# Patient Record
Sex: Male | Born: 1955
Health system: Southern US, Community
[De-identification: ages and names within clinical notes are randomized; demographics above are authoritative.]

## PROBLEM LIST (undated history)

## (undated) DIAGNOSIS — R7303 Prediabetes: Secondary | ICD-10-CM

## (undated) DIAGNOSIS — F419 Anxiety disorder, unspecified: Secondary | ICD-10-CM

## (undated) DIAGNOSIS — I10 Essential (primary) hypertension: Secondary | ICD-10-CM

## (undated) DIAGNOSIS — I809 Phlebitis and thrombophlebitis of unspecified site: Secondary | ICD-10-CM

## (undated) DIAGNOSIS — C20 Malignant neoplasm of rectum: Secondary | ICD-10-CM

## (undated) DIAGNOSIS — R002 Palpitations: Secondary | ICD-10-CM

## (undated) HISTORY — DX: Palpitations: R00.2

## (undated) HISTORY — PX: COLONOSCOPY W/ POLYPECTOMY: SHX1380

## (undated) HISTORY — DX: Essential (primary) hypertension: I10

## (undated) HISTORY — DX: Phlebitis and thrombophlebitis of unspecified site: I80.9

## (undated) HISTORY — PX: WISDOM TOOTH EXTRACTION: SHX21

---

## 2007-05-16 ENCOUNTER — Ambulatory Visit: Payer: Self-pay | Admitting: Vascular Surgery

## 2007-08-10 HISTORY — PX: DOPPLER ECHOCARDIOGRAPHY: SHX263

## 2010-12-14 NOTE — Procedures (Signed)
DUPLEX DEEP VENOUS EXAM - LOWER EXTREMITY   INDICATION:  Right leg pain and swelling.   HISTORY:  Edema:  Yes  Trauma/Surgery:  Yes  Pain:  Right thigh  PE:  No  Previous DVT:  No   AGE:  55   DUPLEX EXAM:                CFV   SFV   PopV  PTV    GSV                R  L  R  L  R  L  R   L  R  L  Thrombosis    o  o  o     o     o      +  Spontaneous   +  +  +     +     +      o  Phasic        +  +  +     +     +      o  Augmentation  +  +  +     +     +      o  Compressible  +     +     +     +      o  Competent     +     +     +     +      o   Legend:  + - yes  o - no  p - partial  D - decreased   IMPRESSION:  1. No evidence of DVT noted in the right leg.  2. Thrombus noted in the right saphenofemoral junction and the greater      saphenous vein above knee level.    _____________________________  Larina Earthly, M.D.   MG/MEDQ  D:  05/16/2007  T:  05/17/2007  Job:  [id number]   cc:   Chales Salmon. Abigail Miyamoto, M.D.

## 2011-01-26 ENCOUNTER — Telehealth: Payer: Self-pay | Admitting: Cardiovascular Disease

## 2011-01-26 NOTE — Telephone Encounter (Signed)
PT LAST HERE IN 08 FOR SAME CHIEF COMPLAINTS AS GIVING TODAY. THINKS HE REALLY NEEDS TO SEE SOMEONE. ITS BEEN 4 YEARS, HE WANTS SOMETHING IN NEXT FEW DAYS. DON'T HAVE ANYTHING TO OFFER HIM. CHART GIVEN TO JODETTE.

## 2011-01-26 NOTE — Telephone Encounter (Signed)
App made 

## 2011-01-27 ENCOUNTER — Encounter: Payer: Self-pay | Admitting: Cardiology

## 2011-01-28 ENCOUNTER — Encounter: Payer: Self-pay | Admitting: Cardiovascular Disease

## 2011-01-28 ENCOUNTER — Ambulatory Visit (INDEPENDENT_AMBULATORY_CARE_PROVIDER_SITE_OTHER): Payer: BC Managed Care – PPO | Admitting: Cardiovascular Disease

## 2011-01-28 VITALS — BP 152/108 | HR 83 | Ht 75.0 in | Wt 210.0 lb

## 2011-01-28 DIAGNOSIS — I10 Essential (primary) hypertension: Secondary | ICD-10-CM

## 2011-01-28 DIAGNOSIS — R002 Palpitations: Secondary | ICD-10-CM | POA: Insufficient documentation

## 2011-01-28 MED ORDER — POTASSIUM CHLORIDE ER 10 MEQ PO TBCR: 10.0000 meq | EXTENDED_RELEASE_TABLET | ORAL | Status: DC

## 2011-01-28 MED ORDER — HYDROCHLOROTHIAZIDE 25 MG PO TABS: 25.0000 mg | ORAL_TABLET | Freq: Every day | ORAL | Status: DC

## 2011-01-28 MED ORDER — LOSARTAN POTASSIUM 50 MG PO TABS: 50.0000 mg | ORAL_TABLET | Freq: Every day | ORAL | Status: DC

## 2011-01-28 NOTE — Progress Notes (Signed)
Brent Potter Date of Birth  06-Oct-1955 [facility] [address]     [address] [place], [place]  [address] [phone]  Fax  [phone]  History of Present Illness:  Palpitations for the past month.  No cp. No dyspnea. Still running and biking regularly - with no difficulty.  Not associated with eating or drinking.  He stopped taking his supplements and now feels better.  Has had hypertension for the past several years.  For the past months, his BP has been elevated 120-150 / 80-94.  Stopped caffeine. Not eating salt.  Does eat a fair amount of carbs.     Current Outpatient Prescriptions on File Prior to Visit  Medication Sig Dispense Refill  . aspirin 81 MG EC tablet Take 81 mg by mouth daily.          No Known Allergies  Past Medical History  Diagnosis Date  . Palpitations   . Phlebitis     due to basketball injury    Past Surgical History  Procedure Date  . Doppler echocardiography 08/10/2007    EF 55-60%, impaired LV relataion, trace mitrial & tricuspid regurgitation    History  Smoking status  . Never Smoker   Smokeless tobacco  . Not on file    History  Alcohol Use No    Family History  Problem Relation Age of Onset  . Hypertension Father   . Thyroid disease Mother     Reviw of Systems:  Reviewed in the HPI.  All other systems are negative.  Physical Exam: BP 152/108  Pulse 83  Ht 6\' 3"  (1.905 m)  Wt 210 lb (95.255 kg)  BMI 26.25 kg/m2 The patient is alert and oriented x 3.  The mood and affect are normal.   Skin: warm and dry.  Color is normal.    HEENT:   the sclera are nonicteric.  The mucous membranes are moist.  The carotids are 2+ without bruits.  There is no thyromegaly.  There is no JVD.    Lungs: clear.  The chest wall is non tender.    Heart: regular rate with a normal S1 and S2.  There are no murmurs, gallops, or rubs. The PMI is not displaced.     Abdomin: good bowel sounds.  There  is no guarding or rebound.  There is no hepatosplenomegaly or tenderness.  There are no masses.   Extremities:  no clubbing, cyanosis, or edema.  The legs are without rashes.  The distal pulses are intact.   Neuro:  Cranial nerves II - XII are intact.  Motor and sensory functions are intact.    The gait is normal.  ECG: Normal sinus rhythm. He has no EKG changes  Assessment / Plan:

## 2011-01-28 NOTE — Assessment & Plan Note (Signed)
His palpitations seem To have resolved after he stopped taking several other supplements. He'll try supplements one at a time to see if he can identify which one is causing the palpitations.

## 2011-01-28 NOTE — Assessment & Plan Note (Signed)
His blood pressure remains mildly elevated. He states that it has been elevated for the past year or so. He has not wanted to start medications for fear of the side effects.  We'll start him on HCTZ 25 mg a day. He's to take 12-1/2 mg a day for the first several days. I've also given him a prescription for losartan 50 mg I was. He is to start that in a week or so. I've also given him potassium chloride 10 mEq a day. We'll see him again in 6 Weeks. We'll draw a basic metabolic profile at that time.

## 2011-01-31 ENCOUNTER — Encounter: Payer: Self-pay | Admitting: Cardiovascular Disease

## 2011-03-31 ENCOUNTER — Ambulatory Visit (INDEPENDENT_AMBULATORY_CARE_PROVIDER_SITE_OTHER): Payer: BC Managed Care – PPO | Admitting: Cardiovascular Disease

## 2011-03-31 ENCOUNTER — Encounter: Payer: Self-pay | Admitting: Cardiovascular Disease

## 2011-03-31 DIAGNOSIS — I10 Essential (primary) hypertension: Secondary | ICD-10-CM

## 2011-03-31 DIAGNOSIS — R002 Palpitations: Secondary | ICD-10-CM

## 2011-03-31 LAB — HEPATIC FUNCTION PANEL
AST: 28 U/L (ref 0–37)
Albumin: 4 g/dL (ref 3.5–5.2)
Total Protein: 7.2 g/dL (ref 6.0–8.3)

## 2011-03-31 LAB — LDL CHOLESTEROL, DIRECT: Direct LDL: 158.6 mg/dL

## 2011-03-31 LAB — BASIC METABOLIC PANEL
CO2: 32 mEq/L (ref 19–32)
Creatinine, Ser: 0.9 mg/dL (ref 0.4–1.5)
GFR: 88.5 mL/min (ref >60.00)
Glucose, Bld: 82 mg/dL (ref 70–99)
Sodium: 141 mEq/L (ref 135–145)

## 2011-03-31 LAB — LIPID PANEL
Cholesterol: 228 mg/dL — ABNORMAL HIGH (ref 0–200)
Total CHOL/HDL Ratio: 6
Triglycerides: 160 mg/dL — ABNORMAL HIGH (ref 0.0–149.0)
VLDL: 32 mg/dL (ref 0.0–40.0)

## 2011-03-31 NOTE — Assessment & Plan Note (Addendum)
Continue his current meds.  BMP pending.  Seems to be tolerating his medications quite well. He was seen again in one year for followup visit. We'll check a basic profile, lipid profile, and hepatic profile at that time.

## 2011-03-31 NOTE — Progress Notes (Signed)
Brent Potter Date of Birth  23-Feb-1956 [facility] / [facility] [address]     [address] [place], [place]  [address] [phone]  Fax  [phone]  History of Present Illness:  This is a 55 year old gentleman with a history of hypertension. He was seen several weeks ago. We started HCTZ and potassium. He did quite well his blood pressure was well controlled. We also started him on Losartan but his BP was too low . He is now discontinued the Losartan and  now feels quite well. His blood pressures have been well controlled.  He's remained very active. He cycling are irregular basis and has not had any problems   Current Outpatient Prescriptions on File Prior to Visit  Medication Sig Dispense Refill  . aspirin 81 MG EC tablet Take 81 mg by mouth daily.        . hydrochlorothiazide 25 MG tablet Take 1 tablet (25 mg total) by mouth daily.  30 tablet  12  . potassium chloride (K-DUR) 10 MEQ tablet Take 1 tablet (10 mEq total) by mouth 1 dose over 24 hours.  30 tablet  12  . testosterone (TESTIM) 50 MG/5GM GEL Place 5 g onto the skin daily.        Marland Kitchen venlafaxine (EFFEXOR-XR) 150 MG 24 hr capsule Take 150 mg by mouth daily.          No Known Allergies  Past Medical History  Diagnosis Date  . Palpitations   . Phlebitis     due to basketball injury  . HTN (hypertension)     Past Surgical History  Procedure Date  . Doppler echocardiography 08/10/2007    EF 55-60%, impaired LV relataion, trace mitrial & tricuspid regurgitation    History  Smoking status  . Never Smoker   Smokeless tobacco  . Not on file    History  Alcohol Use No    Family History  Problem Relation Age of Onset  . Hypertension Father   . Thyroid disease Mother     Reviw of Systems:  Reviewed in the HPI.  All other systems are negative.  Physical Exam: BP 114/78  Ht 6\' 3"  (1.905 m)  Wt 220 lb (99.791 kg)  BMI 27.50 kg/m2 The patient is alert and oriented x 3.   The mood and affect are normal.   Skin: warm and dry.  Color is normal.    HEENT:   the sclera are nonicteric.  The mucous membranes are moist.  The carotids are 2+ without bruits.  There is no thyromegaly.  There is no JVD.    Lungs: clear.  The chest wall is non tender.    Heart: regular rate with a normal S1 and S2.  There are no murmurs, gallops, or rubs. The PMI is not displaced.     Abdomen: good bowel sounds.  There is no guarding or rebound.  There is no hepatosplenomegaly or tenderness.  There are no masses.   Extremities:  no clubbing, cyanosis, or edema.  The legs are without rashes.  The distal pulses are intact.   Neuro:  Cranial nerves II - XII are intact.  Motor and sensory functions are intact.    The gait is normal.  ECG:  Assessment / Plan:

## 2011-03-31 NOTE — Assessment & Plan Note (Signed)
He continues to have some intermittent palpitations which I suspect are due to premature ventricular contractions. These are not troublesome. They're not associated with any specific symptoms. We'll continue to watch his electrolytes.

## 2011-04-05 ENCOUNTER — Telehealth: Payer: Self-pay | Admitting: *Deleted

## 2011-04-05 DIAGNOSIS — E785 Hyperlipidemia, unspecified: Secondary | ICD-10-CM

## 2011-04-05 MED ORDER — ATORVASTATIN CALCIUM 20 MG PO TABS: 20.0000 mg | ORAL_TABLET | Freq: Every day | ORAL | Status: DC

## 2011-04-05 NOTE — Telephone Encounter (Signed)
Informed pt i called in script for Lipitor 20 mg and he will need repeat labs in 3 months/orders placed. To call back with questions or concerns.

## 2011-04-05 NOTE — Telephone Encounter (Signed)
Message copied by Antony Odea on Tue Apr 05, 2011  4:41 PM ------      Message from: [place], Deloris Ping      Created: Tue Apr 05, 2011  5:59 AM       LDL is still elevated.  He should start Lipator 20 mg daily.  Recheck lipids, HFP, BMP in 3 months.

## 2011-04-06 ENCOUNTER — Other Ambulatory Visit: Payer: Self-pay | Admitting: *Deleted

## 2011-04-06 ENCOUNTER — Telehealth: Payer: Self-pay | Admitting: *Deleted

## 2011-04-06 DIAGNOSIS — E785 Hyperlipidemia, unspecified: Secondary | ICD-10-CM

## 2011-04-06 MED ORDER — ATORVASTATIN CALCIUM 20 MG PO TABS: 20.0000 mg | ORAL_TABLET | Freq: Every day | ORAL | Status: DC

## 2011-04-06 NOTE — Telephone Encounter (Signed)
He would like to wait to start Lipitor/ stating he was on vacation and has been eating terrible. Feels their not true values. Will have redraw end of next month and have you evaluate from there. App scheduled for lab redraw. FYI Alfonso Ramus RN

## 2011-04-06 NOTE — Telephone Encounter (Signed)
ok 

## 2011-05-24 ENCOUNTER — Other Ambulatory Visit: Payer: BC Managed Care – PPO | Admitting: *Deleted

## 2015-11-16 DIAGNOSIS — E782 Mixed hyperlipidemia: Secondary | ICD-10-CM | POA: Diagnosis not present

## 2015-11-16 DIAGNOSIS — Z125 Encounter for screening for malignant neoplasm of prostate: Secondary | ICD-10-CM | POA: Diagnosis not present

## 2015-11-16 DIAGNOSIS — E291 Testicular hypofunction: Secondary | ICD-10-CM | POA: Diagnosis not present

## 2015-11-16 DIAGNOSIS — I10 Essential (primary) hypertension: Secondary | ICD-10-CM | POA: Diagnosis not present

## 2015-11-16 DIAGNOSIS — Z79899 Other long term (current) drug therapy: Secondary | ICD-10-CM | POA: Diagnosis not present

## 2015-11-16 DIAGNOSIS — Z0000 Encounter for general adult medical examination without abnormal findings: Secondary | ICD-10-CM | POA: Diagnosis not present

## 2015-11-16 DIAGNOSIS — E559 Vitamin D deficiency, unspecified: Secondary | ICD-10-CM | POA: Diagnosis not present

## 2016-06-10 DIAGNOSIS — L304 Erythema intertrigo: Secondary | ICD-10-CM | POA: Diagnosis not present

## 2016-06-21 DIAGNOSIS — M25521 Pain in right elbow: Secondary | ICD-10-CM | POA: Diagnosis not present

## 2016-06-21 DIAGNOSIS — M7711 Lateral epicondylitis, right elbow: Secondary | ICD-10-CM | POA: Diagnosis not present

## 2016-07-08 DIAGNOSIS — M7711 Lateral epicondylitis, right elbow: Secondary | ICD-10-CM | POA: Diagnosis not present

## 2016-07-08 DIAGNOSIS — M79632 Pain in left forearm: Secondary | ICD-10-CM | POA: Diagnosis not present

## 2016-07-15 DIAGNOSIS — M7711 Lateral epicondylitis, right elbow: Secondary | ICD-10-CM | POA: Diagnosis not present

## 2016-07-15 DIAGNOSIS — M79632 Pain in left forearm: Secondary | ICD-10-CM | POA: Diagnosis not present

## 2016-07-29 DIAGNOSIS — M7711 Lateral epicondylitis, right elbow: Secondary | ICD-10-CM | POA: Diagnosis not present

## 2016-07-29 DIAGNOSIS — M79632 Pain in left forearm: Secondary | ICD-10-CM | POA: Diagnosis not present

## 2016-08-05 DIAGNOSIS — M79632 Pain in left forearm: Secondary | ICD-10-CM | POA: Diagnosis not present

## 2016-08-05 DIAGNOSIS — M7711 Lateral epicondylitis, right elbow: Secondary | ICD-10-CM | POA: Diagnosis not present

## 2016-08-12 DIAGNOSIS — M7711 Lateral epicondylitis, right elbow: Secondary | ICD-10-CM | POA: Diagnosis not present

## 2016-08-12 DIAGNOSIS — M79632 Pain in left forearm: Secondary | ICD-10-CM | POA: Diagnosis not present

## 2016-08-16 DIAGNOSIS — M7711 Lateral epicondylitis, right elbow: Secondary | ICD-10-CM | POA: Diagnosis not present

## 2016-08-16 DIAGNOSIS — M79632 Pain in left forearm: Secondary | ICD-10-CM | POA: Diagnosis not present

## 2016-08-19 DIAGNOSIS — M79632 Pain in left forearm: Secondary | ICD-10-CM | POA: Diagnosis not present

## 2016-08-19 DIAGNOSIS — M7711 Lateral epicondylitis, right elbow: Secondary | ICD-10-CM | POA: Diagnosis not present

## 2016-08-24 DIAGNOSIS — M79632 Pain in left forearm: Secondary | ICD-10-CM | POA: Diagnosis not present

## 2016-08-24 DIAGNOSIS — M7711 Lateral epicondylitis, right elbow: Secondary | ICD-10-CM | POA: Diagnosis not present

## 2016-08-26 DIAGNOSIS — M79632 Pain in left forearm: Secondary | ICD-10-CM | POA: Diagnosis not present

## 2016-08-26 DIAGNOSIS — M7711 Lateral epicondylitis, right elbow: Secondary | ICD-10-CM | POA: Diagnosis not present

## 2016-08-29 DIAGNOSIS — M7711 Lateral epicondylitis, right elbow: Secondary | ICD-10-CM | POA: Diagnosis not present

## 2016-08-29 DIAGNOSIS — M79632 Pain in left forearm: Secondary | ICD-10-CM | POA: Diagnosis not present

## 2016-09-02 DIAGNOSIS — M7711 Lateral epicondylitis, right elbow: Secondary | ICD-10-CM | POA: Diagnosis not present

## 2016-09-02 DIAGNOSIS — M79632 Pain in left forearm: Secondary | ICD-10-CM | POA: Diagnosis not present

## 2016-09-07 DIAGNOSIS — M7711 Lateral epicondylitis, right elbow: Secondary | ICD-10-CM | POA: Diagnosis not present

## 2016-09-07 DIAGNOSIS — M79632 Pain in left forearm: Secondary | ICD-10-CM | POA: Diagnosis not present

## 2016-09-16 DIAGNOSIS — M7711 Lateral epicondylitis, right elbow: Secondary | ICD-10-CM | POA: Diagnosis not present

## 2016-09-16 DIAGNOSIS — M79632 Pain in left forearm: Secondary | ICD-10-CM | POA: Diagnosis not present

## 2016-09-21 DIAGNOSIS — M7711 Lateral epicondylitis, right elbow: Secondary | ICD-10-CM | POA: Diagnosis not present

## 2016-09-21 DIAGNOSIS — M79632 Pain in left forearm: Secondary | ICD-10-CM | POA: Diagnosis not present

## 2016-09-23 DIAGNOSIS — M7711 Lateral epicondylitis, right elbow: Secondary | ICD-10-CM | POA: Diagnosis not present

## 2016-09-23 DIAGNOSIS — M79632 Pain in left forearm: Secondary | ICD-10-CM | POA: Diagnosis not present

## 2016-09-30 DIAGNOSIS — M79632 Pain in left forearm: Secondary | ICD-10-CM | POA: Diagnosis not present

## 2016-09-30 DIAGNOSIS — M7711 Lateral epicondylitis, right elbow: Secondary | ICD-10-CM | POA: Diagnosis not present

## 2016-10-07 DIAGNOSIS — M79632 Pain in left forearm: Secondary | ICD-10-CM | POA: Diagnosis not present

## 2016-10-07 DIAGNOSIS — M7711 Lateral epicondylitis, right elbow: Secondary | ICD-10-CM | POA: Diagnosis not present

## 2016-10-14 DIAGNOSIS — M79632 Pain in left forearm: Secondary | ICD-10-CM | POA: Diagnosis not present

## 2016-10-14 DIAGNOSIS — M7711 Lateral epicondylitis, right elbow: Secondary | ICD-10-CM | POA: Diagnosis not present

## 2016-10-21 DIAGNOSIS — M79632 Pain in left forearm: Secondary | ICD-10-CM | POA: Diagnosis not present

## 2016-10-21 DIAGNOSIS — M7711 Lateral epicondylitis, right elbow: Secondary | ICD-10-CM | POA: Diagnosis not present

## 2017-03-14 DIAGNOSIS — E291 Testicular hypofunction: Secondary | ICD-10-CM | POA: Diagnosis not present

## 2017-03-14 DIAGNOSIS — L03811 Cellulitis of head [any part, except face]: Secondary | ICD-10-CM | POA: Diagnosis not present

## 2017-03-14 DIAGNOSIS — Z0000 Encounter for general adult medical examination without abnormal findings: Secondary | ICD-10-CM | POA: Diagnosis not present

## 2017-03-14 DIAGNOSIS — Z125 Encounter for screening for malignant neoplasm of prostate: Secondary | ICD-10-CM | POA: Diagnosis not present

## 2017-03-14 DIAGNOSIS — E559 Vitamin D deficiency, unspecified: Secondary | ICD-10-CM | POA: Diagnosis not present

## 2017-03-14 DIAGNOSIS — I10 Essential (primary) hypertension: Secondary | ICD-10-CM | POA: Diagnosis not present

## 2017-03-14 DIAGNOSIS — E782 Mixed hyperlipidemia: Secondary | ICD-10-CM | POA: Diagnosis not present

## 2017-03-14 DIAGNOSIS — E8881 Metabolic syndrome: Secondary | ICD-10-CM | POA: Diagnosis not present

## 2017-03-14 DIAGNOSIS — Z79899 Other long term (current) drug therapy: Secondary | ICD-10-CM | POA: Diagnosis not present

## 2017-10-28 DIAGNOSIS — J209 Acute bronchitis, unspecified: Secondary | ICD-10-CM | POA: Diagnosis not present

## 2018-03-23 DIAGNOSIS — Z125 Encounter for screening for malignant neoplasm of prostate: Secondary | ICD-10-CM | POA: Diagnosis not present

## 2018-03-23 DIAGNOSIS — Z0001 Encounter for general adult medical examination with abnormal findings: Secondary | ICD-10-CM | POA: Diagnosis not present

## 2018-03-23 DIAGNOSIS — E291 Testicular hypofunction: Secondary | ICD-10-CM | POA: Diagnosis not present

## 2018-03-23 DIAGNOSIS — Z79899 Other long term (current) drug therapy: Secondary | ICD-10-CM | POA: Diagnosis not present

## 2018-03-23 DIAGNOSIS — E559 Vitamin D deficiency, unspecified: Secondary | ICD-10-CM | POA: Diagnosis not present

## 2018-03-23 DIAGNOSIS — E782 Mixed hyperlipidemia: Secondary | ICD-10-CM | POA: Diagnosis not present

## 2018-03-23 DIAGNOSIS — I10 Essential (primary) hypertension: Secondary | ICD-10-CM | POA: Diagnosis not present

## 2018-11-30 DIAGNOSIS — L814 Other melanin hyperpigmentation: Secondary | ICD-10-CM | POA: Diagnosis not present

## 2018-11-30 DIAGNOSIS — D2272 Melanocytic nevi of left lower limb, including hip: Secondary | ICD-10-CM | POA: Diagnosis not present

## 2018-11-30 DIAGNOSIS — L821 Other seborrheic keratosis: Secondary | ICD-10-CM | POA: Diagnosis not present

## 2018-11-30 DIAGNOSIS — C44519 Basal cell carcinoma of skin of other part of trunk: Secondary | ICD-10-CM | POA: Diagnosis not present

## 2018-11-30 DIAGNOSIS — D1801 Hemangioma of skin and subcutaneous tissue: Secondary | ICD-10-CM | POA: Diagnosis not present

## 2018-11-30 DIAGNOSIS — L570 Actinic keratosis: Secondary | ICD-10-CM | POA: Diagnosis not present

## 2019-03-29 DIAGNOSIS — L570 Actinic keratosis: Secondary | ICD-10-CM | POA: Diagnosis not present

## 2019-04-12 DIAGNOSIS — Z125 Encounter for screening for malignant neoplasm of prostate: Secondary | ICD-10-CM | POA: Diagnosis not present

## 2019-04-12 DIAGNOSIS — Z131 Encounter for screening for diabetes mellitus: Secondary | ICD-10-CM | POA: Diagnosis not present

## 2019-04-12 DIAGNOSIS — E559 Vitamin D deficiency, unspecified: Secondary | ICD-10-CM | POA: Diagnosis not present

## 2019-04-12 DIAGNOSIS — N529 Male erectile dysfunction, unspecified: Secondary | ICD-10-CM | POA: Diagnosis not present

## 2019-04-12 DIAGNOSIS — Z0000 Encounter for general adult medical examination without abnormal findings: Secondary | ICD-10-CM | POA: Diagnosis not present

## 2019-04-12 DIAGNOSIS — I10 Essential (primary) hypertension: Secondary | ICD-10-CM | POA: Diagnosis not present

## 2019-04-12 DIAGNOSIS — E291 Testicular hypofunction: Secondary | ICD-10-CM | POA: Diagnosis not present

## 2019-04-12 DIAGNOSIS — E8881 Metabolic syndrome: Secondary | ICD-10-CM | POA: Diagnosis not present

## 2019-04-12 DIAGNOSIS — E782 Mixed hyperlipidemia: Secondary | ICD-10-CM | POA: Diagnosis not present

## 2019-04-12 DIAGNOSIS — B009 Herpesviral infection, unspecified: Secondary | ICD-10-CM | POA: Diagnosis not present

## 2019-05-26 DIAGNOSIS — Z1211 Encounter for screening for malignant neoplasm of colon: Secondary | ICD-10-CM | POA: Diagnosis not present

## 2019-05-26 DIAGNOSIS — Z1212 Encounter for screening for malignant neoplasm of rectum: Secondary | ICD-10-CM | POA: Diagnosis not present

## 2019-07-19 DIAGNOSIS — Z1211 Encounter for screening for malignant neoplasm of colon: Secondary | ICD-10-CM | POA: Diagnosis not present

## 2019-07-19 DIAGNOSIS — Z01818 Encounter for other preprocedural examination: Secondary | ICD-10-CM | POA: Diagnosis not present

## 2019-08-02 DIAGNOSIS — F0280 Dementia in other diseases classified elsewhere without behavioral disturbance: Secondary | ICD-10-CM

## 2019-08-02 HISTORY — DX: Dementia in other diseases classified elsewhere, unspecified severity, without behavioral disturbance, psychotic disturbance, mood disturbance, and anxiety: F02.80

## 2019-08-09 DIAGNOSIS — I10 Essential (primary) hypertension: Secondary | ICD-10-CM | POA: Diagnosis not present

## 2019-08-09 DIAGNOSIS — Z1211 Encounter for screening for malignant neoplasm of colon: Secondary | ICD-10-CM | POA: Diagnosis not present

## 2019-08-09 DIAGNOSIS — R195 Other fecal abnormalities: Secondary | ICD-10-CM | POA: Diagnosis not present

## 2019-08-09 DIAGNOSIS — R1031 Right lower quadrant pain: Secondary | ICD-10-CM | POA: Diagnosis not present

## 2019-08-26 DIAGNOSIS — K4090 Unilateral inguinal hernia, without obstruction or gangrene, not specified as recurrent: Secondary | ICD-10-CM | POA: Diagnosis not present

## 2019-09-13 ENCOUNTER — Other Ambulatory Visit: Payer: Self-pay | Admitting: Surgery

## 2019-09-13 DIAGNOSIS — K4090 Unilateral inguinal hernia, without obstruction or gangrene, not specified as recurrent: Secondary | ICD-10-CM | POA: Diagnosis not present

## 2020-03-08 DIAGNOSIS — R0902 Hypoxemia: Secondary | ICD-10-CM | POA: Diagnosis not present

## 2020-03-08 DIAGNOSIS — R55 Syncope and collapse: Secondary | ICD-10-CM | POA: Diagnosis not present

## 2020-03-08 DIAGNOSIS — I959 Hypotension, unspecified: Secondary | ICD-10-CM | POA: Diagnosis not present

## 2020-03-08 DIAGNOSIS — R52 Pain, unspecified: Secondary | ICD-10-CM | POA: Diagnosis not present

## 2020-03-09 DIAGNOSIS — S7002XA Contusion of left hip, initial encounter: Secondary | ICD-10-CM | POA: Diagnosis not present

## 2020-03-09 DIAGNOSIS — M25552 Pain in left hip: Secondary | ICD-10-CM | POA: Diagnosis not present

## 2020-03-12 DIAGNOSIS — I10 Essential (primary) hypertension: Secondary | ICD-10-CM | POA: Diagnosis not present

## 2020-03-12 DIAGNOSIS — Z131 Encounter for screening for diabetes mellitus: Secondary | ICD-10-CM | POA: Diagnosis not present

## 2020-03-12 DIAGNOSIS — R4020 Unspecified coma: Secondary | ICD-10-CM | POA: Diagnosis not present

## 2020-03-12 DIAGNOSIS — H532 Diplopia: Secondary | ICD-10-CM | POA: Diagnosis not present

## 2020-03-12 DIAGNOSIS — W57XXXA Bitten or stung by nonvenomous insect and other nonvenomous arthropods, initial encounter: Secondary | ICD-10-CM | POA: Diagnosis not present

## 2020-03-12 DIAGNOSIS — E7800 Pure hypercholesterolemia, unspecified: Secondary | ICD-10-CM | POA: Diagnosis not present

## 2020-03-12 DIAGNOSIS — R5383 Other fatigue: Secondary | ICD-10-CM | POA: Diagnosis not present

## 2020-03-12 DIAGNOSIS — E559 Vitamin D deficiency, unspecified: Secondary | ICD-10-CM | POA: Diagnosis not present

## 2020-03-13 DIAGNOSIS — R55 Syncope and collapse: Secondary | ICD-10-CM | POA: Diagnosis not present

## 2020-03-16 ENCOUNTER — Other Ambulatory Visit: Payer: Self-pay | Admitting: Orthopedic Surgery

## 2020-03-16 DIAGNOSIS — M25552 Pain in left hip: Secondary | ICD-10-CM

## 2020-03-18 DIAGNOSIS — M25551 Pain in right hip: Secondary | ICD-10-CM | POA: Diagnosis not present

## 2020-03-19 DIAGNOSIS — H04123 Dry eye syndrome of bilateral lacrimal glands: Secondary | ICD-10-CM | POA: Diagnosis not present

## 2020-03-19 DIAGNOSIS — H532 Diplopia: Secondary | ICD-10-CM | POA: Diagnosis not present

## 2020-03-19 DIAGNOSIS — H40013 Open angle with borderline findings, low risk, bilateral: Secondary | ICD-10-CM | POA: Diagnosis not present

## 2020-03-25 DIAGNOSIS — S32435A Nondisplaced fracture of anterior column [iliopubic] of left acetabulum, initial encounter for closed fracture: Secondary | ICD-10-CM | POA: Diagnosis not present

## 2020-03-25 DIAGNOSIS — S32591A Other specified fracture of right pubis, initial encounter for closed fracture: Secondary | ICD-10-CM | POA: Diagnosis not present

## 2020-03-25 DIAGNOSIS — M25552 Pain in left hip: Secondary | ICD-10-CM | POA: Diagnosis not present

## 2020-04-09 DIAGNOSIS — D225 Melanocytic nevi of trunk: Secondary | ICD-10-CM | POA: Diagnosis not present

## 2020-04-09 DIAGNOSIS — L821 Other seborrheic keratosis: Secondary | ICD-10-CM | POA: Diagnosis not present

## 2020-04-09 DIAGNOSIS — D1801 Hemangioma of skin and subcutaneous tissue: Secondary | ICD-10-CM | POA: Diagnosis not present

## 2020-04-09 DIAGNOSIS — Z85828 Personal history of other malignant neoplasm of skin: Secondary | ICD-10-CM | POA: Diagnosis not present

## 2020-04-09 DIAGNOSIS — L570 Actinic keratosis: Secondary | ICD-10-CM | POA: Diagnosis not present

## 2020-04-24 DIAGNOSIS — S32434D Nondisplaced fracture of anterior column [iliopubic] of right acetabulum, subsequent encounter for fracture with routine healing: Secondary | ICD-10-CM | POA: Diagnosis not present

## 2020-06-11 DIAGNOSIS — I10 Essential (primary) hypertension: Secondary | ICD-10-CM | POA: Diagnosis not present

## 2020-06-11 DIAGNOSIS — E291 Testicular hypofunction: Secondary | ICD-10-CM | POA: Diagnosis not present

## 2020-06-11 DIAGNOSIS — Z125 Encounter for screening for malignant neoplasm of prostate: Secondary | ICD-10-CM | POA: Diagnosis not present

## 2020-06-11 DIAGNOSIS — Z0000 Encounter for general adult medical examination without abnormal findings: Secondary | ICD-10-CM | POA: Diagnosis not present

## 2020-06-11 DIAGNOSIS — E559 Vitamin D deficiency, unspecified: Secondary | ICD-10-CM | POA: Diagnosis not present

## 2020-06-11 DIAGNOSIS — N529 Male erectile dysfunction, unspecified: Secondary | ICD-10-CM | POA: Diagnosis not present

## 2020-06-11 DIAGNOSIS — E782 Mixed hyperlipidemia: Secondary | ICD-10-CM | POA: Diagnosis not present

## 2020-06-11 DIAGNOSIS — K4090 Unilateral inguinal hernia, without obstruction or gangrene, not specified as recurrent: Secondary | ICD-10-CM | POA: Diagnosis not present

## 2020-06-15 DIAGNOSIS — Z1159 Encounter for screening for other viral diseases: Secondary | ICD-10-CM | POA: Diagnosis not present

## 2020-06-18 DIAGNOSIS — K5730 Diverticulosis of large intestine without perforation or abscess without bleeding: Secondary | ICD-10-CM | POA: Diagnosis not present

## 2020-06-18 DIAGNOSIS — D123 Benign neoplasm of transverse colon: Secondary | ICD-10-CM | POA: Diagnosis not present

## 2020-06-18 DIAGNOSIS — C20 Malignant neoplasm of rectum: Secondary | ICD-10-CM | POA: Diagnosis not present

## 2020-06-18 DIAGNOSIS — D122 Benign neoplasm of ascending colon: Secondary | ICD-10-CM | POA: Diagnosis not present

## 2020-06-18 DIAGNOSIS — R195 Other fecal abnormalities: Secondary | ICD-10-CM | POA: Diagnosis not present

## 2020-07-07 ENCOUNTER — Ambulatory Visit: Payer: Self-pay | Admitting: Surgery

## 2020-07-07 DIAGNOSIS — C20 Malignant neoplasm of rectum: Secondary | ICD-10-CM | POA: Diagnosis not present

## 2020-07-08 ENCOUNTER — Other Ambulatory Visit (HOSPITAL_COMMUNITY): Payer: Self-pay | Admitting: Surgery

## 2020-07-08 ENCOUNTER — Other Ambulatory Visit: Payer: Self-pay | Admitting: Surgery

## 2020-07-08 DIAGNOSIS — C20 Malignant neoplasm of rectum: Secondary | ICD-10-CM

## 2020-07-13 ENCOUNTER — Ambulatory Visit: Payer: Self-pay | Admitting: Surgery

## 2020-07-13 DIAGNOSIS — C20 Malignant neoplasm of rectum: Secondary | ICD-10-CM | POA: Diagnosis not present

## 2020-07-13 NOTE — H&P (Signed)
Brent Potter Appointment: 07/13/2020 4:30 PM Location: [facility] Patient #: [id number] DOB: June 04, 1956 Divorced / Language: Cleophus Molt / Race: White Male  History of Present Illness Adin Hector MD; 07/13/2020 5:21 PM) The patient is a 64 year old male who presents with colorectal cancer. Note for "Colorectal cancer": ` ` ` Patient sent for surgical consultation at the request of Dr Clarene Essex, [facility]  Chief Complaint: Cancer found within rectal polyp. ` ` The patient is a gentleman with no prior colorectal history who was found to have a positive: Guarded. Underwent colonoscopy. Found to have some adenomas polyps. The larger one was found in the rectum. This is removed in a piecemeal fashion by Dr. Watt Climes. Pathology revealed a focus of adenocarcinoma within it at least T1 SM 1. Margin close to deep margin. Surgical consult requested to see if patient pain candidate for TEM resection for better margins in the hope that this is an early stage cancer. Patient was initially hesitant to consider full workup and now is proceeding with CEA and CT scans. Patient can comes by himself. He usually moves his bowels twice a day. Does not recall any history of bleeding. Was told he's had hemorrhoids. He's never had any abdominal surgery. He is to bicycle rather regularly. Place some intermedial sports. Does not smoke. No diabetes. No sleep apnea. No history of stroke or heart attack or COPD. GI personal family history underwhelming except for a maternal uncle that have been diagnosed with Crohn's. No surgeries.  No personal nor family history of GI/colon cancer, irritable bowel syndrome, allergy such as Celiac Sprue, dietary/dairy problems, colitis, ulcers nor gastritis. No recent sick contacts/gastroenteritis. No travel outside the country. No changes in diet. No dysphagia to solids or liquids. No significant heartburn or reflux. No melena, hematemesis, coffee  ground emesis. No evidence of prior gastric/peptic ulceration.  (Review of systems as stated in this history (HPI) or in the review of systems. Otherwise all other 12 point ROS are negative) ` ` ###########################################`  This patient encounter took 45 minutes today to perform the following: obtain history, perform exam, review outside records, interpret tests & imaging, counsel the patient on their diagnosis; and, document this encounter, including findings & plan in the electronic health record (EHR).   Allergies Mallie Snooks, CMA; 07/13/2020 3:49 PM) No Known Drug Allergies [09/13/2019]: Allergies Reconciled  Medication History Mallie Snooks, CMA; 07/13/2020 3:52 PM) CoQ10 (100MG  Capsule, Oral) Active. Testim (50 MG/5GM(1%) Gel, Transdermal) Active. Effexor XR (150MG  Capsule ER 24HR, Oral) Active. Multiple Vitamin (1 (one) Oral) Active. Omega-3 (1000MG  Capsule, Oral) Active. Vitamin D3 (125 MCG(5000 UT) Tablet, Oral) Active. Medications Reconciled    Vitals Mallie Snooks CMA; 07/13/2020 3:52 PM) 07/13/2020 3:52 PM Weight: 204.25 lb Height: 75in Body Surface Area: 2.21 m Body Mass Index: 25.53 kg/m  Temp.: 71F  Pulse: 97 (Regular)  P.OX: 97% (Room air) BP: 118/70(Sitting, Left Arm, Standard)        Physical Exam Adin Hector MD; 07/13/2020 4:36 PM)  General Mental Status-Alert. General Appearance-Not in acute distress, Not Sickly. Orientation-Oriented X3. Hydration-Well hydrated. Voice-Normal.  Integumentary Global Assessment Upon inspection and palpation of skin surfaces of the - Axillae: non-tender, no inflammation or ulceration, no drainage. and Distribution of scalp and body hair is normal. General Characteristics Temperature - normal warmth is noted.  Head and Neck Head-normocephalic, atraumatic with no lesions or palpable masses. Face Global Assessment - atraumatic, no absence of  expression. Neck Global Assessment - no abnormal movements,  no bruit auscultated on the right, no bruit auscultated on the left, no decreased range of motion, non-tender. Trachea-midline. Thyroid Gland Characteristics - non-tender.  Eye Eyeball - Left-Extraocular movements intact, No Nystagmus - Left. Eyeball - Right-Extraocular movements intact, No Nystagmus - Right. Cornea - Left-No Hazy - Left. Cornea - Right-No Hazy - Right. Sclera/Conjunctiva - Left-No scleral icterus, No Discharge - Left. Sclera/Conjunctiva - Right-No scleral icterus, No Discharge - Right. Pupil - Left-Direct reaction to light normal. Pupil - Right-Direct reaction to light normal.  ENMT Ears Pinna - Left - no drainage observed, no generalized tenderness observed. Pinna - Right - no drainage observed, no generalized tenderness observed. Nose and Sinuses External Inspection of the Nose - no destructive lesion observed. Inspection of the nares - Left - quiet respiration. Inspection of the nares - Right - quiet respiration. Mouth and Throat Lips - Upper Lip - no fissures observed, no pallor noted. Lower Lip - no fissures observed, no pallor noted. Nasopharynx - no discharge present. Oral Cavity/Oropharynx - Tongue - no dryness observed. Oral Mucosa - no cyanosis observed. Hypopharynx - no evidence of airway distress observed.  Chest and Lung Exam Inspection Movements - Normal and Symmetrical. Accessory muscles - No use of accessory muscles in breathing. Palpation Palpation of the chest reveals - Non-tender. Auscultation Breath sounds - Normal and Clear.  Cardiovascular Auscultation Rhythm - Regular. Murmurs & Other Heart Sounds - Auscultation of the heart reveals - No Murmurs and No Systolic Clicks.  Abdomen Inspection Inspection of the abdomen reveals - No Visible peristalsis and No Abnormal pulsations. Umbilicus - No Bleeding, No Urine drainage. Palpation/Percussion Palpation and  Percussion of the abdomen reveal - Soft, Non Tender, No Rebound tenderness, No Rigidity (guarding) and No Cutaneous hyperesthesia. Note: Abdomen soft. Nontender. Not distended. No umbilical or incisional hernias. No guarding.  Male Genitourinary Sexual Maturity Tanner 5 - Adult hair pattern and Adult penile size and shape. Note: Small but definite right inguinal hernia. Mild sensitivity left groin but no definite hernia there. Otherwise normal external male genitalia  Rectal Note: Perianal skin Skin clear. No anal fissure. No fistula. No abscess. Right posterior grade 3 prolapsing hemorrhoid.   I can palpate no scarring or diveting 7 cm circumferentially. Rectal wall full of soft stool.  Peripheral Vascular Upper Extremity Inspection - Left - No Cyanotic nailbeds - Left, Not Ischemic. Inspection - Right - No Cyanotic nailbeds - Right, Not Ischemic.  Neurologic Neurologic evaluation reveals -normal attention span and ability to concentrate, able to name objects and repeat phrases. Appropriate fund of knowledge , normal sensation and normal coordination. Mental Status Affect - not angry, not paranoid. Cranial Nerves-Normal Bilaterally. Gait-Normal.  Neuropsychiatric Mental status exam performed with findings of-able to articulate well with normal speech/language, rate, volume and coherence, thought content normal with ability to perform basic computations and apply abstract reasoning and no evidence of hallucinations, delusions, obsessions or homicidal/suicidal ideation. Note: Mildly anxious but consolable  Musculoskeletal Global Assessment Spine, Ribs and Pelvis - no instability, subluxation or laxity. Right Upper Extremity - no instability, subluxation or laxity.  Lymphatic Head & Neck  General Head & Neck Lymphatics: Bilateral - Description - No Localized lymphadenopathy. Axillary  General Axillary Region: Bilateral - Description - No Localized  lymphadenopathy. Femoral & Inguinal  Generalized Femoral & Inguinal Lymphatics: Left - Description - No Localized lymphadenopathy. Right - Description - No Localized lymphadenopathy.    Assessment & Plan Adin Hector MD; 07/13/2020 5:23 PM)  RECTAL CANCER (C20) Impression: Small  focus of adenocarcinoma within a rectal polyp status post polypectomy. Possible early as T1sm1.  Discussed options. Standard robotic rectosigmoid low anterior resection versus transanal TEM/TAMIS local full-thickness resection with closure for better margins.  He would like to try the less invasive TAMIS option for now. We will plan a bedside rigid proctoscopy for orientation and then robotic transanal TAMIS excision with closure for better margins. Should that show no residual cancer, that would argue towards and very very early rectal cancer. Then endoscopic follow-up. Should there have residual cancer within the specimen, then I would lean more toward standard robotic rectosigmoid resection. Because he hopes that we just need better margins on this to avoid a bigger operation, he would like to proceed with transanal robotic TAMIS resection first. Discussed with him. He did want to have handouts about standard MIS colorectal surgery as well so I gave that to him.  Patient then called his daughter who had many questions. I went back into the room and again discussed my recommendations to the daughter on the phone as well. Finnean Cerami up in [place]. She asked many appropriate questions. She's tried to help understand this to help her father as well. She expressed understanding appreciation. Patient was appreciative as well.  Current Plans Pt Education - [facility] TEM Education (Jayne Peckenpaugh): discussed with patient and provided information. The anatomy & physiology of the digestive tract was discussed. The pathophysiology of the rectal pathology was discussed. Natural history risks without surgery was discussed. I feel  the risks of no intervention will lead to serious problems that outweigh the operative risks; therefore, I recommended surgery.  Laparoscopic & open abdominal techniques were discussed. I recommended we start with a partial proctectomy by transanal endoscopic microsurgery (TEM) for excisional biopsy to remove the pathology and hopefully cure and/or control the pathology. This technique can offer less operative risk and faster post-operative recovery. Possible need for immediate or later abdominal surgery for further treatment was discussed.  Risks such as bleeding, abscess, reoperation, ostomy, heart attack, death, and other risks were discussed. I noted a good likelihood this will help address the problem. Goals of post-operative recovery were discussed as well. We will work to minimize complications. An educational handout was given as well. Questions were answered. The patient expresses understanding & wishes to proceed with surgery.  Pt Education - [facility] Colorectal Cancer (AT): discussed with patient and provided information. You are being scheduled for surgery- Our schedulers will call you.  You should hear from our office's scheduling department within 5 working days about the location, date, and time of surgery. We try to make accommodations for patient's preferences in scheduling surgery, but sometimes the OR schedule or the surgeon's schedule prevents [place] from making those accommodations.  If you have not heard from our office [phone]) in 5 working days, call the office and ask for your surgeon's nurse.  If you have other questions about your diagnosis, plan, or surgery, call the office and ask for your surgeon's nurse.  Written instructions provided  PREOP COLON - ENCOUNTER FOR PREOPERATIVE EXAMINATION FOR GENERAL SURGICAL PROCEDURE (Z01.818)  Current Plans Pt Education - [facility] Colon Bowel Prep 2018 ERAS/Miralax/Antibiotics Started Neomycin Sulfate 500 MG Oral Tablet, 2  (two) Tablet SEE NOTE, #6, 07/13/2020, No Refill. Local Order: Pharmacist Notes: TAKE TWO TABLETS AT 2 PM, 3 PM, AND 10 PM THE DAY PRIOR TO SURGERY Started metroNIDAZOLE 500 MG Oral Tablet, 2 (two) Tablet three times daily, #6, 1 day starting 07/13/2020, No Refill. Local Order: Pharmacist  Notes: Pharmacy Instructions: Take 2 tablets at 2pm, 3pm, and 10pm the day prior to your colon operation. Pt Education - Pamphlet Given - Laparoscopic Colorectal Surgery: discussed with patient and provided information.  Adin Hector, MD, FACS, MASCRS Gastrointestinal and Minimally Invasive Surgery  [facility] [address], [place], [place] [address] [phone] Fax [phone] Main/Paging  CONTACT INFORMATION: Weekday (9AM-5PM) concerns: Call [facility] main office at [phone] Weeknight (5PM-9AM) or Weekend/Holiday concerns: Check [email or web address] for General Surgery [facility] coverage (Please, do not use SecureChat as it is not reliable communication to operating surgeons for immediate patient care)

## 2020-07-14 ENCOUNTER — Ambulatory Visit (HOSPITAL_COMMUNITY): Payer: BC Managed Care – PPO

## 2020-07-17 ENCOUNTER — Ambulatory Visit (HOSPITAL_COMMUNITY)
Admission: RE | Admit: 2020-07-17 | Discharge: 2020-07-17 | Disposition: A | Discharge status: Home / Self Care | Payer: BC Managed Care – PPO | Source: Ambulatory Visit | Attending: Surgery | Admitting: Surgery

## 2020-07-17 DIAGNOSIS — C20 Malignant neoplasm of rectum: Secondary | ICD-10-CM

## 2020-07-17 DIAGNOSIS — K8020 Calculus of gallbladder without cholecystitis without obstruction: Secondary | ICD-10-CM | POA: Diagnosis not present

## 2020-07-17 DIAGNOSIS — R918 Other nonspecific abnormal finding of lung field: Secondary | ICD-10-CM | POA: Diagnosis not present

## 2020-07-17 LAB — POCT I-STAT CREATININE: Creatinine, Ser: 1 mg/dL (ref 0.61–1.24)

## 2020-07-17 MED ORDER — IOHEXOL 300 MG/ML  SOLN
100.0000 mL | Freq: Once | INTRAMUSCULAR | Status: AC | PRN
  Administered 2020-07-17: 100 mL via INTRAVENOUS

## 2020-08-05 NOTE — Patient Instructions (Addendum)
DUE TO COVID-19 ONLY ONE VISITOR IS ALLOWED TO COME WITH YOU AND STAY IN THE WAITING ROOM ONLY DURING PRE OP AND PROCEDURE.   IF YOU WILL BE ADMITTED INTO THE HOSPITAL YOU ARE ALLOWED ONE SUPPORT PERSON DURING VISITATION HOURS ONLY (10AM -8PM)   . The support person may change daily. . The support person must pass our screening, gel in and out, and wear a mask at all times, including in the patient's room. . Patients must also wear a mask when staff or their support person are in the room.   COVID SWAB TESTING MUST BE COMPLETED ON:  Tuesday, 08-11-20 @ 8:30 AM   [address] [place], [place] [address]  (Must self quarantine after testing. Follow instructions on handout.)    Your procedure is scheduled on:   Friday, 08-14-20   Report to [facility] Main  Entrance   Report to admitting at 12:30 PM   Call this number if you have problems the morning of surgery [phone]    Clear liquids day of prep to prevent dehydration.   Do not eat food :After Midnight.   May have liquids until 11:30 AM day of surgery  CLEAR LIQUID DIET  Foods Allowed                                                                     Foods Excluded  Water, Black Coffee and tea, regular and decaf             liquids that you cannot  Plain Jell-O in any flavor  (No red)                                    see through such as: Fruit ices (not with fruit pulp)                                      milk, soups, orange juice              Iced Popsicles (No red)                                      All solid food                                   Apple juices Sports drinks like Gatorade (No red) Lightly seasoned clear broth or consume(fat free) Sugar, honey syrup  Sample Menu Breakfast                                Lunch                                     Supper Cranberry juice  Beef broth                            Chicken broth Jell-O                                     Grape juice                            Apple juice Coffee or tea                        Jell-O                                      Popsicle                                                Coffee or tea                        Coffee or tea      Drink 2 Ensure drinks the night before surgery, complete by 10:00 PM.   Complete one Ensure drink the morning of surgery 3 hours prior to scheduled surgery at 11:30 AM   Oral Hygiene is also important to reduce your risk of infection.                                    Remember - BRUSH YOUR TEETH THE MORNING OF SURGERY WITH YOUR REGULAR TOOTHPASTE   Do NOT smoke after Midnight   Take these medicines the morning of surgery with A SIP OF WATER:   Effexor                               You may not have any metal on your body including jewelry, and body piercings             Do not wear  lotions, powders, perfumes/cologne, or deodorant             Men may shave face and neck.   Do not bring valuables to the hospital. [place].   Contacts, dentures or bridgework may not be worn into surgery.   Bring small overnight bag day of surgery.                 Please read over the following fact sheets you were given: IF YOU HAVE QUESTIONS ABOUT YOUR PRE OP INSTRUCTIONS PLEASE CALL [phone]   [place] - Preparing for Surgery Before surgery, you can play an important role.  Because skin is not sterile, your skin needs to be as free of germs as possible.  You can reduce the number of germs on your skin by washing with CHG (chlorahexidine gluconate) soap before surgery.  CHG is an antiseptic cleaner which kills germs and bonds with the skin to continue killing germs even after washing. Please DO NOT use if  you have an allergy to CHG or antibacterial soaps.  If your skin becomes reddened/irritated stop using the CHG and inform your nurse when you arrive at Short Stay. Do not shave (including legs and underarms) for at least 48 hours  prior to the first CHG shower.  You may shave your face/neck.  Please follow these instructions carefully:  1.  Shower with CHG Soap the night before surgery and the  morning of surgery.  2.  If you choose to wash your hair, wash your hair first as usual with your normal  shampoo.  3.  After you shampoo, rinse your hair and body thoroughly to remove the shampoo.                             4.  Use CHG as you would any other liquid soap.  You can apply chg directly to the skin and wash.  Gently with a scrungie or clean washcloth.  5.  Apply the CHG Soap to your body ONLY FROM THE NECK DOWN.   Do   not use on face/ open                           Wound or open sores. Avoid contact with eyes, ears mouth and   genitals (private parts).                       Wash face,  Genitals (private parts) with your normal soap.             6.  Wash thoroughly, paying special attention to the area where your    surgery  will be performed.  7.  Thoroughly rinse your body with warm water from the neck down.  8.  DO NOT shower/wash with your normal soap after using and rinsing off the CHG Soap.                9.  Pat yourself dry with a clean towel.            10.  Wear clean pajamas.            11.  Place clean sheets on your bed the night of your first shower and do not  sleep with pets. Day of Surgery : Do not apply any lotions/deodorants the morning of surgery.  Please wear clean clothes to the hospital/surgery center.  FAILURE TO FOLLOW THESE INSTRUCTIONS MAY RESULT IN THE CANCELLATION OF YOUR SURGERY  PATIENT SIGNATURE_________________________________  NURSE SIGNATURE__________________________________  ________________________________________________________________________   Adam Phenix  An incentive spirometer is a tool that can help keep your lungs clear and active. This tool measures how well you are filling your lungs with each breath. Taking long deep breaths may help reverse or  decrease the chance of developing breathing (pulmonary) problems (especially infection) following:  A long period of time when you are unable to move or be active. BEFORE THE PROCEDURE   If the spirometer includes an indicator to show your best effort, your nurse or respiratory therapist will set it to a desired goal.  If possible, sit up straight or lean slightly forward. Try not to slouch.  Hold the incentive spirometer in an upright position. INSTRUCTIONS FOR USE  1. Sit on the edge of your bed if possible, or sit up as far as you can in bed or on a chair. 2. Hold  the incentive spirometer in an upright position. 3. Breathe out normally. 4. Place the mouthpiece in your mouth and seal your lips tightly around it. 5. Breathe in slowly and as deeply as possible, raising the piston or the ball toward the top of the column. 6. Hold your breath for 3-5 seconds or for as long as possible. Allow the piston or ball to fall to the bottom of the column. 7. Remove the mouthpiece from your mouth and breathe out normally. 8. Rest for a few seconds and repeat Steps 1 through 7 at least 10 times every 1-2 hours when you are awake. Take your time and take a few normal breaths between deep breaths. 9. The spirometer may include an indicator to show your best effort. Use the indicator as a goal to work toward during each repetition. 10. After each set of 10 deep breaths, practice coughing to be sure your lungs are clear. If you have an incision (the cut made at the time of surgery), support your incision when coughing by placing a pillow or rolled up towels firmly against it. Once you are able to get out of bed, walk around indoors and cough well. You may stop using the incentive spirometer when instructed by your caregiver.  RISKS AND COMPLICATIONS  Take your time so you do not get dizzy or light-headed.  If you are in pain, you may need to take or ask for pain medication before doing incentive spirometry.  It is harder to take a deep breath if you are having pain. AFTER USE  Rest and breathe slowly and easily.  It can be helpful to keep track of a log of your progress. Your caregiver can provide you with a simple table to help with this. If you are using the spirometer at home, follow these instructions: SEEK MEDICAL CARE IF:   You are having difficultly using the spirometer.  You have trouble using the spirometer as often as instructed.  Your pain medication is not giving enough relief while using the spirometer.  You develop fever of 100.5 F (38.1 C) or higher. SEEK IMMEDIATE MEDICAL CARE IF:   You cough up bloody sputum that had not been present before.  You develop fever of 102 F (38.9 C) or greater.  You develop worsening pain at or near the incision site. MAKE SURE YOU:   Understand these instructions.  Will watch your condition.  Will get help right away if you are not doing well or get worse. Document Released: 11/28/2006 Document Revised: 10/10/2011 Document Reviewed: 01/29/2007 ExitCare Patient Information 2014 ExitCare, [place].   ________________________________________________________________________  WHAT IS A BLOOD TRANSFUSION? Blood Transfusion Information  A transfusion is the replacement of blood or some of its parts. Blood is made up of multiple cells which provide different functions.  Red blood cells carry oxygen and are used for blood loss replacement.  White blood cells fight against infection.  Platelets control bleeding.  Plasma helps clot blood.  Other blood products are available for specialized needs, such as hemophilia or other clotting disorders. BEFORE THE TRANSFUSION  Who gives blood for transfusions?   Healthy volunteers who are fully evaluated to make sure their blood is safe. This is blood bank blood. Transfusion therapy is the safest it has ever been in the practice of medicine. Before blood is taken from a donor, a complete  history is taken to make sure that person has no history of diseases nor engages in risky social behavior (examples are intravenous drug  use or sexual activity with multiple partners). The donor's travel history is screened to minimize risk of transmitting infections, such as malaria. The donated blood is tested for signs of infectious diseases, such as HIV and hepatitis. The blood is then tested to be sure it is compatible with you in order to minimize the chance of a transfusion reaction. If you or a relative donates blood, this is often done in anticipation of surgery and is not appropriate for emergency situations. It takes many days to process the donated blood. RISKS AND COMPLICATIONS Although transfusion therapy is very safe and saves many lives, the main dangers of transfusion include:   Getting an infectious disease.  Developing a transfusion reaction. This is an allergic reaction to something in the blood you were given. Every precaution is taken to prevent this. The decision to have a blood transfusion has been considered carefully by your caregiver before blood is given. Blood is not given unless the benefits outweigh the risks. AFTER THE TRANSFUSION  Right after receiving a blood transfusion, you will usually feel much better and more energetic. This is especially true if your red blood cells have gotten low (anemic). The transfusion raises the level of the red blood cells which carry oxygen, and this usually causes an energy increase.  The nurse administering the transfusion will monitor you carefully for complications. HOME CARE INSTRUCTIONS  No special instructions are needed after a transfusion. You may find your energy is better. Speak with your caregiver about any limitations on activity for underlying diseases you may have. SEEK MEDICAL CARE IF:   Your condition is not improving after your transfusion.  You develop redness or irritation at the intravenous (IV) site. SEEK  IMMEDIATE MEDICAL CARE IF:  Any of the following symptoms occur over the next 12 hours:  Shaking chills.  You have a temperature by mouth above 102 F (38.9 C), not controlled by medicine.  Chest, back, or muscle pain.  People around you feel you are not acting correctly or are confused.  Shortness of breath or difficulty breathing.  Dizziness and fainting.  You get a rash or develop hives.  You have a decrease in urine output.  Your urine turns a dark color or changes to pink, red, or brown. Any of the following symptoms occur over the next 10 days:  You have a temperature by mouth above 102 F (38.9 C), not controlled by medicine.  Shortness of breath.  Weakness after normal activity.  The white part of the eye turns yellow (jaundice).  You have a decrease in the amount of urine or are urinating less often.  Your urine turns a dark color or changes to pink, red, or brown. Document Released: 07/15/2000 Document Revised: 10/10/2011 Document Reviewed: 03/03/2008 [facility] Patient Information 2014 [place], [place].  _______________________________________________________________________

## 2020-08-05 NOTE — Progress Notes (Addendum)
COVID Vaccine Completed:  x3 Date COVID Vaccine completed:  Booster 07-2020 COVID vaccine manufacturer: Pfizer       PCP - Marden Noble, MD Cardiologist - Kristeen Miss, MD.  Last OV 2012.  Eval by choice by patient.  He was a runner and wanted to be evaluated, workup negative.  Chest x-ray - CT chest 07-17-20 EKG - 08-06-20 in Epic Stress Test -  ECHO -  Cardiac Cath -  Pacemaker/ICD device last checked:  Sleep Study - N/A CPAP -   Fasting Blood Sugar - N/A Checks Blood Sugar _____ times a day  Blood Thinner Instructions:  N/A Aspirin Instructions: Last Dose:  Anesthesia review:   Patient denies shortness of breath, fever, cough and chest pain at PAT appointment. Pt able to climb a flight of stairs and perform all ADLs independently.   Patient verbalized understanding of instructions that were given to them at the PAT appointment. Patient was also instructed that they will need to review over the PAT instructions again at home before surgery.

## 2020-08-06 ENCOUNTER — Encounter (HOSPITAL_COMMUNITY)
Admission: RE | Admit: 2020-08-06 | Discharge: 2020-08-06 | Disposition: A | Discharge status: Home / Self Care | Payer: BC Managed Care – PPO | Source: Ambulatory Visit | Attending: Surgery | Admitting: Surgery

## 2020-08-06 ENCOUNTER — Encounter (HOSPITAL_COMMUNITY): Payer: Self-pay

## 2020-08-06 ENCOUNTER — Other Ambulatory Visit: Payer: Self-pay

## 2020-08-06 DIAGNOSIS — Z01818 Encounter for other preprocedural examination: Secondary | ICD-10-CM | POA: Diagnosis not present

## 2020-08-06 DIAGNOSIS — I10 Essential (primary) hypertension: Secondary | ICD-10-CM | POA: Diagnosis not present

## 2020-08-06 HISTORY — DX: Anxiety disorder, unspecified: F41.9

## 2020-08-06 HISTORY — DX: Malignant neoplasm of rectum: C20

## 2020-08-06 HISTORY — DX: Prediabetes: R73.03

## 2020-08-06 LAB — CBC
HCT: 49.1 % (ref 39.0–52.0)
Hemoglobin: 16.9 g/dL (ref 13.0–17.0)
MCH: 30.3 pg (ref 26.0–34.0)
MCHC: 34.4 g/dL (ref 30.0–36.0)
MCV: 88 fL (ref 80.0–100.0)
Platelets: 195 10*3/uL (ref 150–400)
RBC: 5.58 MIL/uL (ref 4.22–5.81)
RDW: 12.3 % (ref 11.5–15.5)
WBC: 7.4 10*3/uL (ref 4.0–10.5)
nRBC: 0 % (ref 0.0–0.2)

## 2020-08-06 LAB — BASIC METABOLIC PANEL
Anion gap: 7 (ref 5–15)
BUN: 26 mg/dL — ABNORMAL HIGH (ref 8–23)
CO2: 30 mmol/L (ref 22–32)
Calcium: 9.5 mg/dL (ref 8.9–10.3)
Chloride: 102 mmol/L (ref 98–111)
Creatinine, Ser: 0.91 mg/dL (ref 0.61–1.24)
GFR, Estimated: >60 mL/min (ref >60)
Glucose, Bld: 95 mg/dL (ref 70–99)
Potassium: 4.5 mmol/L (ref 3.5–5.1)
Sodium: 139 mmol/L (ref 135–145)

## 2020-08-06 LAB — HEMOGLOBIN A1C
Hgb A1c MFr Bld: 5.1 % (ref 4.8–5.6)
Mean Plasma Glucose: 99.67 mg/dL

## 2020-08-06 NOTE — Consult Note (Signed)
[facility] Nurse requested for preoperative stoma site marking  Patient was very alarmed on the need for ostomy pouch; long long discussion on best practice for marking and rationale for marking. Met with patient <30 mins to prepare for allowing me to mark this patient    Explained role of the [facility] nurse team.  Provided the patient with educational booklet and provided samples of pouching options.   Examined patient sitting, and standing in order to place the marking in the patient's visual field, away from any creases or abdominal contour issues and within the rectus muscle.  Marked for colostomy in the LLQ  3____ cm to the left of the umbilicus and _8___HT above the umbilicus.  Marked for ileostomy in the RLQ  _4___cm to the right of the umbilicus and  _0.9___ cm above the umbilicus.   Patient's abdomen cleansed with CHG wipes at site markings, allowed to air dry prior to marking.Covered mark with thin film transparent dressing to preserve mark until date of surgery.   [place] Nurse team will follow up with patient after surgery for continue ostomy care and teaching.  [place] MSN, [place], [place], [place]

## 2020-08-11 ENCOUNTER — Other Ambulatory Visit (HOSPITAL_COMMUNITY)
Admission: RE | Admit: 2020-08-11 | Discharge: 2020-08-11 | Disposition: A | Discharge status: Home / Self Care | Payer: BC Managed Care – PPO | Source: Ambulatory Visit | Attending: Surgery | Admitting: Surgery

## 2020-08-11 DIAGNOSIS — C20 Malignant neoplasm of rectum: Secondary | ICD-10-CM | POA: Diagnosis not present

## 2020-08-11 DIAGNOSIS — K642 Third degree hemorrhoids: Secondary | ICD-10-CM | POA: Diagnosis not present

## 2020-08-11 DIAGNOSIS — I10 Essential (primary) hypertension: Secondary | ICD-10-CM | POA: Diagnosis not present

## 2020-08-11 DIAGNOSIS — Z79899 Other long term (current) drug therapy: Secondary | ICD-10-CM | POA: Diagnosis not present

## 2020-08-11 DIAGNOSIS — K621 Rectal polyp: Secondary | ICD-10-CM | POA: Diagnosis not present

## 2020-08-11 DIAGNOSIS — Z20822 Contact with and (suspected) exposure to covid-19: Secondary | ICD-10-CM | POA: Insufficient documentation

## 2020-08-11 DIAGNOSIS — Z01812 Encounter for preprocedural laboratory examination: Secondary | ICD-10-CM | POA: Insufficient documentation

## 2020-08-11 DIAGNOSIS — F419 Anxiety disorder, unspecified: Secondary | ICD-10-CM | POA: Diagnosis not present

## 2020-08-11 LAB — SARS CORONAVIRUS 2 (TAT 6-24 HRS): SARS Coronavirus 2: NEGATIVE

## 2020-08-13 MED ORDER — BUPIVACAINE LIPOSOME 1.3 % IJ SUSP
20.0000 mL | INTRAMUSCULAR | Status: DC
  Filled 2020-08-13: qty 20

## 2020-08-14 ENCOUNTER — Inpatient Hospital Stay (HOSPITAL_COMMUNITY): Payer: BC Managed Care – PPO | Admitting: Registered Nurse

## 2020-08-14 ENCOUNTER — Encounter (HOSPITAL_COMMUNITY)
Admission: RE | Disposition: A | Discharge status: Home / Self Care | Payer: Self-pay | Source: Home / Self Care | Attending: Surgery

## 2020-08-14 ENCOUNTER — Inpatient Hospital Stay (HOSPITAL_COMMUNITY)
Admission: RE | Admit: 2020-08-14 | Discharge: 2020-08-15 | DRG: 349 | Disposition: A | Discharge status: Home / Self Care | Payer: BC Managed Care – PPO | Attending: Surgery | Admitting: Surgery

## 2020-08-14 ENCOUNTER — Other Ambulatory Visit: Payer: Self-pay

## 2020-08-14 ENCOUNTER — Encounter (HOSPITAL_COMMUNITY): Payer: Self-pay | Admitting: Surgery

## 2020-08-14 DIAGNOSIS — B009 Herpesviral infection, unspecified: Secondary | ICD-10-CM | POA: Insufficient documentation

## 2020-08-14 DIAGNOSIS — E291 Testicular hypofunction: Secondary | ICD-10-CM | POA: Insufficient documentation

## 2020-08-14 DIAGNOSIS — C20 Malignant neoplasm of rectum: Secondary | ICD-10-CM | POA: Diagnosis not present

## 2020-08-14 DIAGNOSIS — K642 Third degree hemorrhoids: Secondary | ICD-10-CM | POA: Diagnosis present

## 2020-08-14 DIAGNOSIS — I10 Essential (primary) hypertension: Secondary | ICD-10-CM | POA: Diagnosis present

## 2020-08-14 DIAGNOSIS — Z20822 Contact with and (suspected) exposure to covid-19: Secondary | ICD-10-CM | POA: Diagnosis present

## 2020-08-14 DIAGNOSIS — I8000 Phlebitis and thrombophlebitis of superficial vessels of unspecified lower extremity: Secondary | ICD-10-CM | POA: Insufficient documentation

## 2020-08-14 DIAGNOSIS — R413 Other amnesia: Secondary | ICD-10-CM

## 2020-08-14 DIAGNOSIS — E782 Mixed hyperlipidemia: Secondary | ICD-10-CM | POA: Insufficient documentation

## 2020-08-14 DIAGNOSIS — N529 Male erectile dysfunction, unspecified: Secondary | ICD-10-CM | POA: Insufficient documentation

## 2020-08-14 DIAGNOSIS — R002 Palpitations: Secondary | ICD-10-CM | POA: Diagnosis present

## 2020-08-14 DIAGNOSIS — F419 Anxiety disorder, unspecified: Secondary | ICD-10-CM | POA: Insufficient documentation

## 2020-08-14 DIAGNOSIS — K5730 Diverticulosis of large intestine without perforation or abscess without bleeding: Secondary | ICD-10-CM | POA: Insufficient documentation

## 2020-08-14 DIAGNOSIS — E559 Vitamin D deficiency, unspecified: Secondary | ICD-10-CM | POA: Insufficient documentation

## 2020-08-14 DIAGNOSIS — Z79899 Other long term (current) drug therapy: Secondary | ICD-10-CM

## 2020-08-14 HISTORY — PX: HEMORRHOID SURGERY: SHX153

## 2020-08-14 HISTORY — PX: XI ROBOT ASSISTED RECTOPEXY: SHX6788

## 2020-08-14 LAB — TYPE AND SCREEN
ABO/RH(D): O POS
Antibody Screen: NEGATIVE

## 2020-08-14 LAB — ABO/RH: ABO/RH(D): O POS

## 2020-08-14 SURGERY — RECTOPEXY, ROBOT-ASSISTED
Anesthesia: General | Site: Rectum

## 2020-08-14 MED ORDER — FENTANYL CITRATE (PF) 100 MCG/2ML IJ SOLN
INTRAMUSCULAR | Status: AC
  Filled 2020-08-14: qty 2

## 2020-08-14 MED ORDER — OXYCODONE HCL 5 MG PO TABS: 5.0000 mg | ORAL_TABLET | ORAL | Status: DC | PRN

## 2020-08-14 MED ORDER — ACETAMINOPHEN 500 MG PO TABS
1000.0000 mg | ORAL_TABLET | ORAL | Status: AC
  Administered 2020-08-14: 1000 mg via ORAL
  Filled 2020-08-14: qty 2

## 2020-08-14 MED ORDER — ONDANSETRON HCL 4 MG PO TABS: 4.0000 mg | ORAL_TABLET | Freq: Four times a day (QID) | ORAL | Status: DC | PRN

## 2020-08-14 MED ORDER — SODIUM CHLORIDE 0.9 % IV SOLN
2.0000 g | Freq: Two times a day (BID) | INTRAVENOUS | Status: AC
  Administered 2020-08-15: 2 g via INTRAVENOUS
  Filled 2020-08-14: qty 2

## 2020-08-14 MED ORDER — EPHEDRINE SULFATE-NACL 50-0.9 MG/10ML-% IV SOSY
PREFILLED_SYRINGE | INTRAVENOUS | Status: DC | PRN
  Administered 2020-08-14: 5 mg via INTRAVENOUS
  Administered 2020-08-14: 10 mg via INTRAVENOUS
  Administered 2020-08-14: 5 mg via INTRAVENOUS

## 2020-08-14 MED ORDER — METRONIDAZOLE 500 MG PO TABS: 1000.0000 mg | ORAL_TABLET | ORAL | Status: DC

## 2020-08-14 MED ORDER — LACTATED RINGERS IV SOLN: INTRAVENOUS | Status: DC

## 2020-08-14 MED ORDER — ONDANSETRON HCL 4 MG/2ML IJ SOLN
INTRAMUSCULAR | Status: AC
  Filled 2020-08-14: qty 2

## 2020-08-14 MED ORDER — LACTATED RINGERS IV SOLN: INTRAVENOUS | Status: AC

## 2020-08-14 MED ORDER — MIDAZOLAM HCL 5 MG/5ML IJ SOLN
INTRAMUSCULAR | Status: DC | PRN
  Administered 2020-08-14: 2 mg via INTRAVENOUS

## 2020-08-14 MED ORDER — CHLORHEXIDINE GLUCONATE CLOTH 2 % EX PADS: 6.0000 | MEDICATED_PAD | Freq: Once | CUTANEOUS | Status: DC

## 2020-08-14 MED ORDER — POLYETHYLENE GLYCOL 3350 17 GM/SCOOP PO POWD
1.0000 | Freq: Once | ORAL | Status: DC
  Filled 2020-08-14: qty 255

## 2020-08-14 MED ORDER — LIDOCAINE 2% (20 MG/ML) 5 ML SYRINGE
INTRAMUSCULAR | Status: DC | PRN
  Administered 2020-08-14: 80 mg via INTRAVENOUS
  Administered 2020-08-14: 1.5 mg/kg/h via INTRAVENOUS

## 2020-08-14 MED ORDER — PROPOFOL 10 MG/ML IV BOLUS
INTRAVENOUS | Status: AC
  Filled 2020-08-14: qty 20

## 2020-08-14 MED ORDER — FENTANYL CITRATE (PF) 100 MCG/2ML IJ SOLN
INTRAMUSCULAR | Status: DC | PRN
  Administered 2020-08-14 (×2): 25 ug via INTRAVENOUS
  Administered 2020-08-14: 50 ug via INTRAVENOUS

## 2020-08-14 MED ORDER — ENOXAPARIN SODIUM 40 MG/0.4ML ~~LOC~~ SOLN
40.0000 mg | Freq: Once | SUBCUTANEOUS | Status: AC
  Administered 2020-08-14: 40 mg via SUBCUTANEOUS
  Filled 2020-08-14: qty 0.4

## 2020-08-14 MED ORDER — HYDROMORPHONE HCL 1 MG/ML IJ SOLN: 0.5000 mg | INTRAMUSCULAR | Status: DC | PRN

## 2020-08-14 MED ORDER — ONDANSETRON HCL 4 MG/2ML IJ SOLN: 4.0000 mg | Freq: Four times a day (QID) | INTRAMUSCULAR | Status: DC | PRN

## 2020-08-14 MED ORDER — ONDANSETRON HCL 4 MG/2ML IJ SOLN
INTRAMUSCULAR | Status: DC | PRN
  Administered 2020-08-14: 4 mg via INTRAVENOUS

## 2020-08-14 MED ORDER — SODIUM CHLORIDE 0.9 % IV SOLN
2.0000 g | INTRAVENOUS | Status: AC
  Administered 2020-08-14: 2 g via INTRAVENOUS
  Filled 2020-08-14: qty 2

## 2020-08-14 MED ORDER — ROCURONIUM BROMIDE 10 MG/ML (PF) SYRINGE
PREFILLED_SYRINGE | INTRAVENOUS | Status: AC
  Filled 2020-08-14: qty 10

## 2020-08-14 MED ORDER — DIBUCAINE (PERIANAL) 1 % EX OINT
TOPICAL_OINTMENT | CUTANEOUS | Status: DC | PRN
  Administered 2020-08-14: 1 via RECTAL

## 2020-08-14 MED ORDER — HYDROCORT-PRAMOXINE (PERIANAL) 2.5-1 % EX CREA: 1.0000 "application " | TOPICAL_CREAM | Freq: Four times a day (QID) | CUTANEOUS | Status: DC | PRN

## 2020-08-14 MED ORDER — GABAPENTIN 300 MG PO CAPS
300.0000 mg | ORAL_CAPSULE | ORAL | Status: AC
  Administered 2020-08-14: 300 mg via ORAL
  Filled 2020-08-14: qty 1

## 2020-08-14 MED ORDER — OXYCODONE HCL 5 MG PO TABS: 5.0000 mg | ORAL_TABLET | Freq: Four times a day (QID) | ORAL | 0 refills | Status: DC | PRN

## 2020-08-14 MED ORDER — ENOXAPARIN SODIUM 40 MG/0.4ML ~~LOC~~ SOLN
40.0000 mg | SUBCUTANEOUS | Status: DC
  Administered 2020-08-15: 40 mg via SUBCUTANEOUS
  Filled 2020-08-14: qty 0.4

## 2020-08-14 MED ORDER — NAPROXEN 500 MG PO TABS: 500.0000 mg | ORAL_TABLET | Freq: Two times a day (BID) | ORAL | 1 refills | Status: DC | PRN

## 2020-08-14 MED ORDER — TRAMADOL HCL 50 MG PO TABS
50.0000 mg | ORAL_TABLET | Freq: Four times a day (QID) | ORAL | Status: DC | PRN
  Filled 2020-08-14: qty 1

## 2020-08-14 MED ORDER — BUPIVACAINE-EPINEPHRINE 0.25% -1:200000 IJ SOLN
INTRAMUSCULAR | Status: DC | PRN
  Administered 2020-08-14: 20 mL

## 2020-08-14 MED ORDER — ALVIMOPAN 12 MG PO CAPS
12.0000 mg | ORAL_CAPSULE | Freq: Two times a day (BID) | ORAL | Status: DC
  Administered 2020-08-15: 12 mg via ORAL
  Filled 2020-08-14: qty 1

## 2020-08-14 MED ORDER — CHLORHEXIDINE GLUCONATE 0.12 % MT SOLN
15.0000 mL | Freq: Once | OROMUCOSAL | Status: AC
  Administered 2020-08-14: 15 mL via OROMUCOSAL

## 2020-08-14 MED ORDER — OXYMETAZOLINE HCL 0.05 % NA SOLN: 1.0000 | Freq: Every evening | NASAL | Status: DC | PRN

## 2020-08-14 MED ORDER — ENSURE PRE-SURGERY PO LIQD
296.0000 mL | Freq: Once | ORAL | Status: DC
  Filled 2020-08-14: qty 296

## 2020-08-14 MED ORDER — NEOMYCIN SULFATE 500 MG PO TABS: 1000.0000 mg | ORAL_TABLET | ORAL | Status: DC

## 2020-08-14 MED ORDER — DIPHENHYDRAMINE HCL 50 MG/ML IJ SOLN: 12.5000 mg | Freq: Four times a day (QID) | INTRAMUSCULAR | Status: DC | PRN

## 2020-08-14 MED ORDER — LIDOCAINE HCL (PF) 2 % IJ SOLN
INTRAMUSCULAR | Status: AC
  Filled 2020-08-14: qty 5

## 2020-08-14 MED ORDER — CELECOXIB 200 MG PO CAPS
200.0000 mg | ORAL_CAPSULE | ORAL | Status: AC
  Administered 2020-08-14: 200 mg via ORAL
  Filled 2020-08-14: qty 1

## 2020-08-14 MED ORDER — DEXAMETHASONE SODIUM PHOSPHATE 10 MG/ML IJ SOLN
INTRAMUSCULAR | Status: AC
  Filled 2020-08-14: qty 1

## 2020-08-14 MED ORDER — ORAL CARE MOUTH RINSE: 15.0000 mL | Freq: Once | OROMUCOSAL | Status: AC

## 2020-08-14 MED ORDER — PROCHLORPERAZINE MALEATE 10 MG PO TABS
10.0000 mg | ORAL_TABLET | Freq: Four times a day (QID) | ORAL | Status: DC | PRN
  Filled 2020-08-14: qty 1

## 2020-08-14 MED ORDER — CALCIUM POLYCARBOPHIL 625 MG PO TABS
625.0000 mg | ORAL_TABLET | Freq: Two times a day (BID) | ORAL | Status: DC
  Administered 2020-08-15: 625 mg via ORAL
  Filled 2020-08-14: qty 1

## 2020-08-14 MED ORDER — ACETAMINOPHEN 500 MG PO TABS
1000.0000 mg | ORAL_TABLET | Freq: Four times a day (QID) | ORAL | Status: DC
  Administered 2020-08-14 – 2020-08-15 (×3): 1000 mg via ORAL
  Filled 2020-08-14 (×4): qty 2

## 2020-08-14 MED ORDER — EPHEDRINE 5 MG/ML INJ
INTRAVENOUS | Status: AC
  Filled 2020-08-14: qty 10

## 2020-08-14 MED ORDER — SODIUM CHLORIDE 0.9 % IV SOLN
INTRAVENOUS | Status: DC
  Filled 2020-08-14: qty 6

## 2020-08-14 MED ORDER — BISACODYL 5 MG PO TBEC: 20.0000 mg | DELAYED_RELEASE_TABLET | Freq: Once | ORAL | Status: DC

## 2020-08-14 MED ORDER — VITAMIN D3 25 MCG (1000 UNIT) PO TABS: 5000.0000 [IU] | ORAL_TABLET | Freq: Every day | ORAL | Status: DC

## 2020-08-14 MED ORDER — METOPROLOL TARTRATE 5 MG/5ML IV SOLN: 5.0000 mg | Freq: Four times a day (QID) | INTRAVENOUS | Status: DC | PRN

## 2020-08-14 MED ORDER — DIAZEPAM 5 MG PO TABS: 5.0000 mg | ORAL_TABLET | Freq: Four times a day (QID) | ORAL | Status: DC | PRN

## 2020-08-14 MED ORDER — ENSURE PRE-SURGERY PO LIQD: 592.0000 mL | Freq: Once | ORAL | Status: DC

## 2020-08-14 MED ORDER — WITCH HAZEL-GLYCERIN EX PADS: 1.0000 "application " | MEDICATED_PAD | CUTANEOUS | Status: DC | PRN

## 2020-08-14 MED ORDER — PROCHLORPERAZINE EDISYLATE 10 MG/2ML IJ SOLN
5.0000 mg | Freq: Four times a day (QID) | INTRAMUSCULAR | Status: DC | PRN
Start: 2020-08-14 — End: 2020-08-15

## 2020-08-14 MED ORDER — SUGAMMADEX SODIUM 200 MG/2ML IV SOLN
INTRAVENOUS | Status: DC | PRN
  Administered 2020-08-14: 200 mg via INTRAVENOUS

## 2020-08-14 MED ORDER — DEXAMETHASONE SODIUM PHOSPHATE 10 MG/ML IJ SOLN
INTRAMUSCULAR | Status: DC | PRN
  Administered 2020-08-14: 8 mg via INTRAVENOUS

## 2020-08-14 MED ORDER — ADULT MULTIVITAMIN W/MINERALS CH
1.0000 | ORAL_TABLET | Freq: Every day | ORAL | Status: DC
  Administered 2020-08-15: 1 via ORAL
  Filled 2020-08-14: qty 1

## 2020-08-14 MED ORDER — ROCURONIUM BROMIDE 10 MG/ML (PF) SYRINGE
PREFILLED_SYRINGE | INTRAVENOUS | Status: DC | PRN
  Administered 2020-08-14: 20 mg via INTRAVENOUS
  Administered 2020-08-14: 10 mg via INTRAVENOUS
  Administered 2020-08-14: 60 mg via INTRAVENOUS
  Administered 2020-08-14: 10 mg via INTRAVENOUS

## 2020-08-14 MED ORDER — DIPHENHYDRAMINE HCL 12.5 MG/5ML PO ELIX: 12.5000 mg | ORAL_SOLUTION | Freq: Four times a day (QID) | ORAL | Status: DC | PRN

## 2020-08-14 MED ORDER — PROPOFOL 10 MG/ML IV BOLUS
INTRAVENOUS | Status: DC | PRN
  Administered 2020-08-14: 170 mg via INTRAVENOUS
  Administered 2020-08-14: 30 mg via INTRAVENOUS

## 2020-08-14 MED ORDER — 0.9 % SODIUM CHLORIDE (POUR BTL) OPTIME
TOPICAL | Status: DC | PRN
  Administered 2020-08-14: 1000 mL

## 2020-08-14 MED ORDER — ALUM & MAG HYDROXIDE-SIMETH 200-200-20 MG/5ML PO SUSP: 30.0000 mL | Freq: Four times a day (QID) | ORAL | Status: DC | PRN

## 2020-08-14 MED ORDER — DIAZEPAM 5 MG PO TABS: 5.0000 mg | ORAL_TABLET | Freq: Four times a day (QID) | ORAL | 2 refills | Status: DC | PRN

## 2020-08-14 MED ORDER — BUPIVACAINE-EPINEPHRINE (PF) 0.25% -1:200000 IJ SOLN
INTRAMUSCULAR | Status: AC
  Filled 2020-08-14: qty 30

## 2020-08-14 MED ORDER — SODIUM CHLORIDE 0.9 % IV SOLN: Freq: Three times a day (TID) | INTRAVENOUS | Status: DC | PRN

## 2020-08-14 MED ORDER — MIDAZOLAM HCL 2 MG/2ML IJ SOLN
INTRAMUSCULAR | Status: AC
  Filled 2020-08-14: qty 2

## 2020-08-14 MED ORDER — ALVIMOPAN 12 MG PO CAPS
12.0000 mg | ORAL_CAPSULE | ORAL | Status: AC
  Administered 2020-08-14: 12 mg via ORAL
  Filled 2020-08-14: qty 1

## 2020-08-14 MED ORDER — BUPIVACAINE LIPOSOME 1.3 % IJ SUSP
INTRAMUSCULAR | Status: DC | PRN
  Administered 2020-08-14: 20 mL

## 2020-08-14 MED ORDER — DIBUCAINE (PERIANAL) 1 % EX OINT
TOPICAL_OINTMENT | CUTANEOUS | Status: AC
  Filled 2020-08-14: qty 28

## 2020-08-14 MED ORDER — LIDOCAINE HCL 2 % IJ SOLN
INTRAMUSCULAR | Status: AC
  Filled 2020-08-14: qty 20

## 2020-08-14 MED ORDER — ENSURE SURGERY PO LIQD
237.0000 mL | Freq: Two times a day (BID) | ORAL | Status: DC
  Administered 2020-08-15: 237 mL via ORAL

## 2020-08-14 MED ORDER — GABAPENTIN 100 MG PO CAPS
200.0000 mg | ORAL_CAPSULE | Freq: Three times a day (TID) | ORAL | Status: DC
  Administered 2020-08-14 – 2020-08-15 (×2): 200 mg via ORAL
  Filled 2020-08-14 (×2): qty 2

## 2020-08-14 MED ORDER — LACTATED RINGERS IR SOLN
Status: DC | PRN
  Administered 2020-08-14: 1000 mL

## 2020-08-14 SURGICAL SUPPLY — 51 items
BRIEF STRETCH FOR OB PAD LRG (UNDERPADS AND DIAPERS) ×3 IMPLANT
COVER SURGICAL LIGHT HANDLE (MISCELLANEOUS) ×3 IMPLANT
COVER TIP SHEARS 8 DVNC (MISCELLANEOUS) ×2 IMPLANT
COVER TIP SHEARS 8MM DA VINCI (MISCELLANEOUS) ×3
DRAPE ARM DVNC X/XI (DISPOSABLE) ×8 IMPLANT
DRAPE C-ARM 42X120 X-RAY (DRAPES) IMPLANT
DRAPE COLUMN DVNC XI (DISPOSABLE) ×2 IMPLANT
DRAPE DA VINCI XI ARM (DISPOSABLE) ×12
DRAPE DA VINCI XI COLUMN (DISPOSABLE) ×3
DRAPE ORTHO SPLIT 77X108 STRL (DRAPES)
DRAPE SURG IRRIG POUCH 19X23 (DRAPES) ×3 IMPLANT
DRAPE SURG ORHT 6 SPLT 77X108 (DRAPES) IMPLANT
DRSG PAD ABDOMINAL 8X10 ST (GAUZE/BANDAGES/DRESSINGS) ×1 IMPLANT
ELECT REM PT RETURN 15FT ADLT (MISCELLANEOUS) ×3 IMPLANT
GAUZE SPONGE 4X4 12PLY STRL (GAUZE/BANDAGES/DRESSINGS) ×1 IMPLANT
GLOVE ECLIPSE 8.0 STRL XLNG CF (GLOVE) ×3 IMPLANT
GLOVE INDICATOR 8.0 STRL GRN (GLOVE) ×3 IMPLANT
GOWN STRL REUS W/TWL XL LVL3 (GOWN DISPOSABLE) ×6 IMPLANT
IRRIG SUCT STRYKERFLOW 2 WTIP (MISCELLANEOUS) ×3
IRRIGATION SUCT STRKRFLW 2 WTP (MISCELLANEOUS) ×2 IMPLANT
KIT BASIN OR (CUSTOM PROCEDURE TRAY) ×3 IMPLANT
KIT SIGMOIDOSCOPE (SET/KITS/TRAYS/PACK) ×1 IMPLANT
KIT TURNOVER KIT A (KITS) ×1 IMPLANT
LEGGING LITHOTOMY PAIR STRL (DRAPES) IMPLANT
PAD POSITIONING PINK XL (MISCELLANEOUS) IMPLANT
PLATFORM TRANSANAL ACCESS 4X5 (MISCELLANEOUS) ×2 IMPLANT
PLATFORM TRANSANAL ACCESS 4X5. (MISCELLANEOUS) ×3
SCISSORS LAP 5X35 DISP (ENDOMECHANICALS) IMPLANT
SEAL CANN UNIV 5-8 DVNC XI (MISCELLANEOUS) ×6 IMPLANT
SEAL XI 5MM-8MM UNIVERSAL (MISCELLANEOUS) ×12
SEALER VESSEL DA VINCI XI (MISCELLANEOUS)
SEALER VESSEL EXT DVNC XI (MISCELLANEOUS) IMPLANT
SET TRI-LUMEN FLTR TB AIRSEAL (TUBING) ×3 IMPLANT
SOLUTION ELECTROLUBE (MISCELLANEOUS) ×3 IMPLANT
STOPCOCK 4 WAY LG BORE MALE ST (IV SETS) ×6 IMPLANT
SURGILUBE 2OZ TUBE FLIPTOP (MISCELLANEOUS) ×4 IMPLANT
SUT PROLENE 0 SH 30 (SUTURE) ×5 IMPLANT
SUT SILK 0 SH 30 (SUTURE) IMPLANT
SUT V-LOC BARB 180 2/0GR6 GS22 (SUTURE) ×6
SUT VIC AB 2-0 UR6 27 (SUTURE) ×6 IMPLANT
SUT VLOC BARB 180 ABS3/0GR12 (SUTURE)
SUTURE V-LC BRB 180 2/0GR6GS22 (SUTURE) ×2 IMPLANT
SUTURE VLOC BRB 180 ABS3/0GR12 (SUTURE) IMPLANT
SYR BULB IRRIG 60ML STRL (SYRINGE) ×1 IMPLANT
TOWEL OR 17X26 10 PK STRL BLUE (TOWEL DISPOSABLE) ×3 IMPLANT
TOWEL OR NON WOVEN STRL DISP B (DISPOSABLE) ×3 IMPLANT
TRAY FOLEY MTR SLVR 16FR STAT (SET/KITS/TRAYS/PACK) ×3 IMPLANT
TRAY LAPAROSCOPIC (CUSTOM PROCEDURE TRAY) ×3 IMPLANT
TROCAR ADV FIXATION 5X100MM (TROCAR) IMPLANT
TROCAR PORT AIRSEAL 5X120 (TROCAR) ×3 IMPLANT
YANKAUER SUCT BULB TIP 10FT TU (MISCELLANEOUS) ×1 IMPLANT

## 2020-08-14 NOTE — Discharge Instructions (Signed)
ANORECTAL SURGERY:  POST OPERATIVE INSTRUCTIONS  ######################################################################  EAT Start with a pureed / full liquid diet After 24 hours, gradually transition to a high fiber diet.    CONTROL PAIN Control pain so you can tolerate bowel movements,  walk, sleep, tolerate sneezing/coughing, and go up/down stairs.   HAVE A BOWEL MOVEMENT DAILY Keep your bowels regular to avoid problems.   Taking a fiber supplement every day to keep bowels soft.   Try a laxative to override constipation. Use an antidairrheal to slow down diarrhea.   Call if not better after 2 tries  WALK Walk an hour a day.  Control your pain to do that.   CALL IF YOU HAVE PROBLEMS/CONCERNS Call if you are still struggling despite following these instructions. Call if you have concerns not answered by these instructions  ######################################################################    1. Take your usually prescribed home medications unless otherwise directed.  2. DIET: Follow a light bland diet & liquids the first 24 hours after arrival home, such as soup, liquids, starches, etc.  Be sure to drink plenty of fluids.  Quickly advance to a usual solid diet within a few days.  Avoid fast food or heavy meals as your are more likely to get nauseated or have irregular bowels.  A low-fat, high-fiber diet for the rest of your life is ideal.  3. PAIN CONTROL: a. Pain is best controlled by a usual combination of three different methods TOGETHER: i. Ice/Heat ii. Over the counter pain medication iii. Prescription pain medication b. Expect swelling and discomfort in the anus/rectal area.  Warm water baths (30-60 minutes up to 6 times a day, especially after bowel meovements) will help. Use ice for the first few days to help decrease swelling and bruising, then switch to heat such as warm towels, sitz baths, warm baths, etc to help relax tight/sore spots and speed recovery.   Some people prefer to use ice alone, heat alone, alternating between ice & heat.  Experiment to what works for you.   c. It is helpful to take an over-the-counter pain medication continuously for the first few weeks.  Choose one of the following that works best for you: i. Naproxen (Aleve, etc)  Two 220mg tabs twice a day ii. Ibuprofen (Advil, etc) Three 200mg tabs four times a day (every meal & bedtime) iii. Acetaminophen (Tylenol, etc) 500-650mg four times a day (every meal & bedtime) d. A  prescription for pain medication (such as oxycodone, hydrocodone, etc) should be given to you upon discharge.  Take your pain medication as prescribed.  i. If you are having problems/concerns with the prescription medicine (does not control pain, nausea, vomiting, rash, itching, etc), please call us [phone] to see if we need to switch you to a different pain medicine that will work better for you and/or control your side effect better. ii. If you need a refill on your pain medication, please contact your pharmacy.  They will contact our office to request authorization. Prescriptions will not be filled after 5 pm or on week-ends.  If can take up to 48 hours for it to be filled & ready so avoid waiting until you are down to thel ast pill. e. A topical cream (Dibucaine) or a prescription for a cream (such as diltiazem 2% gel) may be given to you.  Many people find relief with topical creams.  Some people find it burns too much.  Experiment.  If it helps, use it.  If it burns, don't using   it.  Use a Sitz Bath 4-8 times a day for relief   Sitz Bath A sitz bath is a warm water bath taken in the sitting position that covers only the hips and buttocks. It may be used for either healing or hygiene purposes. Sitz baths are also used to relieve pain, itching, or muscle spasms. The water may contain medicine. Moist heat will help you heal and relax.  HOME CARE INSTRUCTIONS  Take 3 to 4 sitz baths a day. 1. Fill the  bathtub half full with warm water. 2. Sit in the water and open the drain a little. 3. Turn on the warm water to keep the tub half full. Keep the water running constantly. 4. Soak in the water for 15 to 20 minutes. 5. After the sitz bath, pat the affected area dry first.   4. KEEP YOUR BOWELS REGULAR a. The goal is one soft bowel movement a day b. Avoid getting constipated.  Between the surgery and the pain medications, it is common to experience some constipation.  Increasing fluid intake and taking a fiber supplement (such as Metamucil, Citrucel, FiberCon, MiraLax, etc) 2-3 times a day regularly will usually help prevent this problem from occurring.  A mild laxative (prune juice, Milk of Magnesia, MiraLax, etc) should be taken according to package directions if there are no bowel movements after 48 hours. c. Watch out for diarrhea.  If you have many loose bowel movements, simplify your diet to bland foods & liquids for a few days.  Stop any stool softeners and decrease your fiber supplement.  Switching to mild anti-diarrheal medications (Kayopectate, Pepto Bismol) can help.  Can try an imodium/loperamide dose.  If this worsens or does not improve, please call us.  5. Wound Care  a. Remove your bandages with your first bowel movement, usually the day after surgery.  Let the gauze fall off with the first bowel movement or shower.   b. Wear an absorbent pad or soft cotton balls in your underwear as needed to catch any drainage and help keep the area  c. Keep the area clean and dry.  Bathe / shower every day.  Keep the area clean by showering / bathing over the incision / wound.   It is okay to soak an open wound to help wash it.  Consider using a squeeze bottle filled with warm water to gently wash the anal area.  Wet wipes or showers / gentle washing after bowel movements is often less traumatic than regular toilet paper. d. You will often notice bleeding with bowel movements.  This should slow down  by the end of the first week of surgery.  Sitting on an ice pack can help. e. Expect some drainage.  This should slow down by the end of the first week of surgery, but you will have occasional bleeding or drainage up to a few months after surgery.  Wear an absorbent pad or soft cotton gauze in your underwear until the drainage stops.  6. ACTIVITIES as tolerated:   a. You may resume regular (light) daily activities beginning the next day--such as daily self-care, walking, climbing stairs--gradually increasing activities as tolerated.  If you can walk 30 minutes without difficulty, it is safe to try more intense activity such as jogging, treadmill, bicycling, low-impact aerobics, swimming, etc. b. Save the most intensive and strenuous activity for last such as sit-ups, heavy lifting, contact sports, etc  Refrain from any heavy lifting or straining until you are off narcotics for   pain control.   c. DO NOT PUSH THROUGH PAIN.  Let pain be your guide: If it hurts to do something, don't do it.  Pain is your body warning you to avoid that activity for another week until the pain goes down. d. You may drive when you are no longer taking prescription pain medication, you can comfortably sit for long periods of time, and you can safely maneuver your car and apply brakes. e. You may have sexual intercourse when it is comfortable.  7. FOLLOW UP in our office a. Please call [facility] at [phone] to set up an appointment to see your surgeon in the office for a follow-up appointment approximately 2-3 weeks after your surgery. b. Make sure that you call for this appointment the day you arrive home to ensure a convenient appointment time.  8. IF YOU HAVE DISABILITY OR FAMILY LEAVE FORMS, BRING THEM TO THE OFFICE FOR PROCESSING.  DO NOT GIVE THEM TO YOUR DOCTOR.        WHEN TO CALL US [phone]: 1. Poor pain control 2. Reactions / problems with new medications (rash/itching, nausea, etc)  3. Fever over  101.5 F (38.5 C) 4. Inability to urinate 5. Nausea and/or vomiting 6. Worsening swelling or bruising 7. Continued bleeding from incision. 8. Increased pain, redness, or drainage from the incision  The clinic staff is available to answer your questions during regular business hours (8:30am-5pm).  Please don't hesitate to call and ask to speak to one of our nurses for clinical concerns.   A surgeon from [facility] is always on call at the hospitals   If you have a medical emergency, go to the nearest emergency room or call 911.    [facility] [address], [address], [place], [place]  [address] ? MAIN: [phone] ? TOLL FREE: [phone] ? FAX [phone] [email or web address]    

## 2020-08-14 NOTE — H&P (Signed)
08/14/2020  Brent Potter DOB: [birth year]  Patient Care Team: Josetta Huddle, MD as PCP - General (Internal Medicine) Clarene Essex, MD as Consulting Physician (Gastroenterology) Michael Boston, MD as Consulting Physician (General Surgery)  ` ` Patient sent for surgical consultation at the request of Dr Clarene Essex, [facility]  Chief Complaint: Cancer found within rectal polyp. ` ` The patient is a gentleman with no prior colorectal history who was found to have a positive: Guarded. Underwent colonoscopy. Found to have some adenomas polyps. The larger one was found in the rectum. This is removed in a piecemeal fashion by Dr. Watt Climes. Pathology revealed a focus of adenocarcinoma within it at least T1 SM 1. Margin close to deep margin. Surgical consult requested to see if patient pain candidate for TEM resection for better margins in the hope that this is an early stage cancer. Patient was initially hesitant to consider full workup and now is proceeding with CEA and CT scans. Patient can comes by himself. He usually moves his bowels twice a day. Does not recall any history of bleeding. Was told he's had hemorrhoids. He's never had any abdominal surgery. He is to bicycle rather regularly. Place some intermedial sports. Does not smoke. No diabetes. No sleep apnea. No history of stroke or heart attack or COPD. GI personal family history underwhelming except for a maternal uncle that have been diagnosed with Crohn's. No surgeries.  No personal nor family history of GI/colon cancer, irritable bowel syndrome, allergy such as Celiac Sprue, dietary/dairy problems, colitis, ulcers nor gastritis. No recent sick contacts/gastroenteritis. No travel outside the country. No changes in diet. No dysphagia to solids or liquids. No significant heartburn or reflux. No melena, hematemesis, coffee ground emesis. No evidence of prior gastric/peptic ulceration.  D/w patient over phone again.   Questions answered  (Review of systems as stated in this history (HPI) or in the review of systems. Otherwise all other 12 point ROS are negative) ` ` ###########################################`  This patient encounter took 45 minutes today to perform the following: obtain history, perform exam, review outside records, interpret tests & imaging, counsel the patient on their diagnosis; and, document this encounter, including findings & plan in the electronic health record (EHR).   Allergies Mallie Snooks, CMA; 07/13/2020 3:49 PM) No Known Drug Allergies [09/13/2019]: Allergies Reconciled  Medication History Mallie Snooks, CMA; 07/13/2020 3:52 PM) CoQ10 (100MG  Capsule, Oral) Active. Testim (50 MG/5GM(1%) Gel, Transdermal) Active. Effexor XR (150MG  Capsule ER 24HR, Oral) Active. Multiple Vitamin (1 (one) Oral) Active. Omega-3 (1000MG  Capsule, Oral) Active. Vitamin D3 (125 MCG(5000 UT) Tablet, Oral) Active. Medications Reconciled    Vitals Mallie Snooks CMA; 07/13/2020 3:52 PM) 07/13/2020 3:52 PM Weight: 204.25 lb Height: 75in Body Surface Area: 2.21 m Body Mass Index: 25.53 kg/m  Temp.: 72F  Pulse: 97 (Regular)  P.OX: 97% (Room air) BP: 118/70(Sitting, Left Arm, Standard)        Physical Exam Adin Hector MD; 07/13/2020 4:36 PM)  General Mental Status-Alert. General Appearance-Not in acute distress, Not Sickly. Orientation-Oriented X3. Hydration-Well hydrated. Voice-Normal.  Integumentary Global Assessment Upon inspection and palpation of skin surfaces of the - Axillae: non-tender, no inflammation or ulceration, no drainage. and Distribution of scalp and body hair is normal. General Characteristics Temperature - normal warmth is noted.  Head and Neck Head-normocephalic, atraumatic with no lesions or palpable masses. Face Global Assessment - atraumatic, no absence of expression. Neck Global  Assessment - no abnormal movements, no bruit auscultated on the right, no bruit auscultated  on the left, no decreased range of motion, non-tender. Trachea-midline. Thyroid Gland Characteristics - non-tender.  Eye Eyeball - Left-Extraocular movements intact, No Nystagmus - Left. Eyeball - Right-Extraocular movements intact, No Nystagmus - Right. Cornea - Left-No Hazy - Left. Cornea - Right-No Hazy - Right. Sclera/Conjunctiva - Left-No scleral icterus, No Discharge - Left. Sclera/Conjunctiva - Right-No scleral icterus, No Discharge - Right. Pupil - Left-Direct reaction to light normal. Pupil - Right-Direct reaction to light normal.  ENMT Ears Pinna - Left - no drainage observed, no generalized tenderness observed. Pinna - Right - no drainage observed, no generalized tenderness observed. Nose and Sinuses External Inspection of the Nose - no destructive lesion observed. Inspection of the nares - Left - quiet respiration. Inspection of the nares - Right - quiet respiration. Mouth and Throat Lips - Upper Lip - no fissures observed, no pallor noted. Lower Lip - no fissures observed, no pallor noted. Nasopharynx - no discharge present. Oral Cavity/Oropharynx - Tongue - no dryness observed. Oral Mucosa - no cyanosis observed. Hypopharynx - no evidence of airway distress observed.  Chest and Lung Exam Inspection Movements - Normal and Symmetrical. Accessory muscles - No use of accessory muscles in breathing. Palpation Palpation of the chest reveals - Non-tender. Auscultation Breath sounds - Normal and Clear.  Cardiovascular Auscultation Rhythm - Regular. Murmurs & Other Heart Sounds - Auscultation of the heart reveals - No Murmurs and No Systolic Clicks.  Abdomen Inspection Inspection of the abdomen reveals - No Visible peristalsis and No Abnormal pulsations. Umbilicus - No Bleeding, No Urine drainage. Palpation/Percussion Palpation and Percussion of the abdomen  reveal - Soft, Non Tender, No Rebound tenderness, No Rigidity (guarding) and No Cutaneous hyperesthesia. Note: Abdomen soft. Nontender. Not distended. No umbilical or incisional hernias. No guarding.  Male Genitourinary Sexual Maturity Tanner 5 - Adult hair pattern and Adult penile size and shape. Note: Small but definite right inguinal hernia. Mild sensitivity left groin but no definite hernia there. Otherwise normal external male genitalia  Rectal Note: Perianal skin Skin clear. No anal fissure. No fistula. No abscess. Right posterior grade 3 prolapsing hemorrhoid.   I can palpate no scarring or diveting 7 cm circumferentially. Rectal wall full of soft stool.  Peripheral Vascular Upper Extremity Inspection - Left - No Cyanotic nailbeds - Left, Not Ischemic. Inspection - Right - No Cyanotic nailbeds - Right, Not Ischemic.  Neurologic Neurologic evaluation reveals -normal attention span and ability to concentrate, able to name objects and repeat phrases. Appropriate fund of knowledge , normal sensation and normal coordination. Mental Status Affect - not angry, not paranoid. Cranial Nerves-Normal Bilaterally. Gait-Normal.  Neuropsychiatric Mental status exam performed with findings of-able to articulate well with normal speech/language, rate, volume and coherence, thought content normal with ability to perform basic computations and apply abstract reasoning and no evidence of hallucinations, delusions, obsessions or homicidal/suicidal ideation. Note: Mildly anxious but consolable  Musculoskeletal Global Assessment Spine, Ribs and Pelvis - no instability, subluxation or laxity. Right Upper Extremity - no instability, subluxation or laxity.  Lymphatic Head & Neck  General Head & Neck Lymphatics: Bilateral - Description - No Localized lymphadenopathy. Axillary  General Axillary Region: Bilateral - Description - No Localized lymphadenopathy. Femoral &  Inguinal  Generalized Femoral & Inguinal Lymphatics: Left - Description - No Localized lymphadenopathy. Right - Description - No Localized lymphadenopathy.    Assessment & Plan Adin Hector MD; 07/13/2020 5:23 PM)  RECTAL CANCER (C20) Impression: Small focus of adenocarcinoma within a rectal polyp status post  polypectomy. Possible early as T1sm1.  Discussed options. Standard robotic rectosigmoid low anterior resection versus transanal TEM/TAMIS local full-thickness resection with closure for better margins.  He would like to try the less invasive TAMIS option for now. We will plan a bedside rigid proctoscopy for orientation and then robotic transanal TAMIS excision with closure for better margins. Should that show no residual cancer, that would argue towards and very very early rectal cancer. Then endoscopic follow-up. Should there have residual cancer within the specimen, then I would lean more toward standard robotic rectosigmoid resection. Because he hopes that we just need better margins on this to avoid a bigger operation, he would like to proceed with transanal robotic TAMIS resection first. Discussed with him. He did want to have handouts about standard MIS colorectal surgery as well so I gave that to him.  Patient then called his daughter who had many questions. I went back into the room and again discussed my recommendations to the daughter on the phone as well. Mikhail Hallenbeck up in [place]. She asked many appropriate questions. She's tried to help understand this to help her father as well. She expressed understanding appreciation. Patient was appreciative as well.  Current Plans Pt Education - [facility] TEM Education (Mersadez Linden): discussed with patient and provided information. The anatomy & physiology of the digestive tract was discussed. The pathophysiology of the rectal pathology was discussed. Natural history risks without surgery was discussed. I feel the risks of no  intervention will lead to serious problems that outweigh the operative risks; therefore, I recommended surgery.  Laparoscopic & open abdominal techniques were discussed. I recommended we start with a partial proctectomy by transanal endoscopic microsurgery (TEM) for excisional biopsy to remove the pathology and hopefully cure and/or control the pathology. This technique can offer less operative risk and faster post-operative recovery. Possible need for immediate or later abdominal surgery for further treatment was discussed.  Risks such as bleeding, abscess, reoperation, ostomy, heart attack, death, and other risks were discussed. I noted a good likelihood this will help address the problem. Goals of post-operative recovery were discussed as well. We will work to minimize complications. An educational handout was given as well. Questions were answered. The patient expresses understanding & wishes to proceed with surgery.  Pt Education - [facility] Colorectal Cancer (AT): discussed with patient and provided information. You are being scheduled for surgery- Our schedulers will call you.  You should hear from our office's scheduling department within 5 working days about the location, date, and time of surgery. We try to make accommodations for patient's preferences in scheduling surgery, but sometimes the OR schedule or the surgeon's schedule prevents [place] from making those accommodations.  If you have not heard from our office [phone]) in 5 working days, call the office and ask for your surgeon's nurse.  If you have other questions about your diagnosis, plan, or surgery, call the office and ask for your surgeon's nurse.  Written instructions provided  PREOP COLON - ENCOUNTER FOR PREOPERATIVE EXAMINATION FOR GENERAL SURGICAL PROCEDURE (Z01.818)  Current Plans Pt Education - [facility] Colon Bowel Prep 2018 ERAS/Miralax/Antibiotics Started Neomycin Sulfate 500 MG Oral Tablet, 2 (two)  Tablet SEE NOTE, #6, 07/13/2020, No Refill. Local Order: Pharmacist Notes: TAKE TWO TABLETS AT 2 PM, 3 PM, AND 10 PM THE DAY PRIOR TO SURGERY Started metroNIDAZOLE 500 MG Oral Tablet, 2 (two) Tablet three times daily, #6, 1 day starting 07/13/2020, No Refill. Local Order: Pharmacist Notes: Pharmacy Instructions: Take 2 tablets at 2pm, 3pm,  and 10pm the day prior to your colon operation. Pt Education - Pamphlet Given - Laparoscopic Colorectal Surgery: discussed with patient and provided information.  Adin Hector, MD, FACS, MASCRS Gastrointestinal and Minimally Invasive Surgery  [facility] [address], [place], [place] [address] [phone] Fax [phone] Main/Paging  CONTACT INFORMATION: Weekday (9AM-5PM) concerns: Call [facility] main office at [phone] Weeknight (5PM-9AM) or Weekend/Holiday concerns: Check [email or web address] for General Surgery [facility] coverage (Please, do not use SecureChat as it is not reliable communication to operating surgeons for immediate patient care)

## 2020-08-14 NOTE — Anesthesia Preprocedure Evaluation (Signed)
Anesthesia Evaluation  Patient identified by MRN, date of birth, ID band Patient awake    Reviewed: Allergy & Precautions, NPO status , Patient's Chart, lab work & pertinent test results  Airway Mallampati: I  TM Distance: >3 FB Neck ROM: Full    Dental no notable dental hx. (+) Teeth Intact, Caps   Pulmonary neg pulmonary ROS,    Pulmonary exam normal breath sounds clear to auscultation       Cardiovascular hypertension, Normal cardiovascular exam Rhythm:Regular Rate:Normal  Not currently on Rx   Neuro/Psych negative neurological ROS  negative psych ROS   GI/Hepatic Neg liver ROS, Rectal Ca within rectal polyp   Endo/Other  Low testosterone  Renal/GU negative Renal ROS  negative genitourinary   Musculoskeletal negative musculoskeletal ROS (+)   Abdominal   Peds  Hematology negative hematology ROS (+)   Anesthesia Other Findings   Reproductive/Obstetrics                             Anesthesia Physical Anesthesia Plan  ASA: II  Anesthesia Plan: General   Post-op Pain Management:    Induction: Intravenous  PONV Risk Score and Plan: Treatment may vary due to age or medical condition, Midazolam and Ondansetron  Airway Management Planned: Oral ETT  Additional Equipment:   Intra-op Plan:   Post-operative Plan: Extubation in OR  Informed Consent: I have reviewed the patients History and Physical, chart, labs and discussed the procedure including the risks, benefits and alternatives for the proposed anesthesia with the patient or authorized representative who has indicated his/her understanding and acceptance.     Dental advisory given  Plan Discussed with: CRNA and Anesthesiologist  Anesthesia Plan Comments:         Anesthesia Quick Evaluation

## 2020-08-14 NOTE — Op Note (Signed)
08/14/2020  3:56 PM  PATIENT:  Brent Potter  65 y.o. male  Patient Care Team: Josetta Huddle, MD as PCP - General (Internal Medicine) Clarene Essex, MD as Consulting Physician (Gastroenterology) Michael Boston, MD as Consulting Physician (General Surgery)  PRE-OPERATIVE DIAGNOSIS:   RECTAL CANCER WITHIN A POLYP s/p PRIOR POLYPECTOMY HEMORRHOIDS  POST-OPERATIVE DIAGNOSIS:  RECTAL CANCER WITHIN A POLYP s/p PRIOR POLYPECTOMY HEMORRHOIDS - PROLAPSING GRADE 3  PROCEDURE:   ROBOTIC PARTIAL PROCTECTOMY (TAMIS) RIGID PROCTOSCOPY HEMORRHOIDAL LIGATION/PEXY  SURGEON:  Adin Hector, MD  ASSISTANT: OR Staff   ANESTHESIA:   local and general  EBL:  Total I/O In: 1900 [I.V.:1800; IV Piggyback:100] Out: 110 [Urine:100; Blood:10]  Delay start of Pharmacological VTE agent (>24hrs) due to surgical blood loss or risk of bleeding:  no  DRAINS: none   SPECIMEN:   Anterior proximal rectal wall scar  DISPOSITION OF SPECIMEN:  PATHOLOGY  COUNTS:  YES  PLAN OF CARE: Admit for overnight observation  PATIENT DISPOSITION:  PACU - hemodynamically stable.  INDICATION:   Patient status post colonoscopy.  Polypectomy done in rectum.  Focus of cancer found within it.  Surgical consultation requested.  Options were discussed such as low anterior resection versus transanal partial proctectomy for full-thickness excision and better margins.  Patient opted for local excision first with the understanding that he may require more definitive resection if more advanced rectal cancer found.  The anatomy & physiology of the digestive tract was discussed.  The pathophysiology of the rectal pathology was discussed.  Natural history risks without surgery was discussed.   I feel the risks of no intervention will lead to serious problems that outweigh the operative risks; therefore, I recommended surgery.    Laparoscopic & open abdominal techniques were discussed.  I recommended we start with a partial  proctectomy by transanal endoscopic microsurgery (TEM) for excisional biopsy to remove the pathology and hopefully cure and/or control the pathology.  This technique can offer less operative risk and faster post-operative recovery.  Possible need for immediate or later abdominal surgery for further treatment was discussed.   Risks such as bleeding, abscess, reoperation, ostomy, heart attack, death, and other risks were discussed.   I noted a good likelihood this will help address the problem.  Goals of post-operative recovery were discussed as well.  We will work to minimize complications.  An educational handout was given as well.  Questions were answered.  The patient expresses understanding & wishes to proceed with surgery.  OR FINDINGS: Stellate scar at anterior midline rectum at proximal rectal valve/fold 10-11 cm from the anal verge involving 20% of the circumference, at center of tattooing  The resulting mass was 4x4 cm in size  Pin placement on pathology specimen: Proximal margin: Red Distal margin: Green Right lateral: White Left lateral:  Yellow  The closure rests 9cm from the anal verge in the anterior location.  It is 50% of the circumference.  DESCRIPTION: Informed consent was confirmed.  Patient received general anesthesia without difficulty.  Foley catheter sterilely placed.  Sequential compression devices active during the entire case.  Decubitus.  I did rigid proctoscopy.  Then tattooing in the proximal rectum.  Stellate scar anterior midline wound over rectal fold.  This seemed to be the location of prior polypectomy.  Therefore the patient was positioned prone jackknife position, taking extra care to secure and protect the patient appropriately.  The perineum and perianal regions were prepped and draped in a sterile fashion.  Surgical timeout confirmed  our plan.  I did a gentle digital rectal examination with gradual anal dilation to allow placement of the GelPOINT Path  Transanal Access system.  Used Prolene suture to help secure it and keep it in.  His anus was quite patulous with prolapsing hemorrhoid making a seal a challenge at first.  We attached ports and the air seal and set pressure to 20.  Got an adequate seal.    XI robot was docked.  Instruments were carefully passed into the proximal rectum.  He did have very enlarged hemorrhoids which made a challenge to pass ports and instruments at first.  Had to remove the gel point and ligate the large left lateral and right posterior hemorrhoids with 2-0 Vicryl in a vertical running fashion for some ligation to help flatten them down a little bit to allow better visualization.  We were better able to carefully get the camera into the mid rectum to confirm proper positioning & find the tattooing & stellate scar at the anterior midline rectum over rectal fold.   I went ahead and used tip point cautery to mark 1.5 -2 cm margins circumferentially.  I then did a full thickness transection at the distal margin.  I came around laterally.   Lifted the specimen off the pelvic canal for a good deep margin.  I gradually came more proximally.  Transected at the proximal margin.  I ensured hemostasis.  I inspected the main specimen and pinned it on thick cork board.  Pins as noted above.  I walked the specimen down to pathology and showed it to the pathology team to confirm proper orientation.    I went back in and scrubbed in.  I replaced the ports and docked the robot.  I reapproximated the wound with a 2-0 V-lock running horizontal mattress sutures, starting at each end laterally and coming medially from each corner.  Then would run the stitch to overlap and reapproximate the mucosa as well.  This brought things together well.  I did meticulous inspection with fine tip instruments to confirm good watertight closure   Hemostasis excellent.  The lumen was quite patent.  Air seal evacuated & instruments removed.  I did anorectal  examination.  Some hemorrhoidal irritation but no evidence of any distal rectal wall perforation or puncture.  The patient still had significant hemorrhoids with irritation.  I proceeded to do hemorrhoidal ligation and pexy.  I used a 2-0 Vicryl suture on a UR-6 needle in a figure-of-eight fashion 6 cm proximal to the anal verge.  I did hemorrhoidal ligation and pexy at the other 4 hemorrhoidal columns.  At the completion of this, all 6 anorectal columns were ligated and pexied in the classic hexagonal fashion (right anterior/lateral/posterior, left anterior/lateral/posterior).  I held off on doing any hemorrhoidectomies at this time given the partial proctectomy.  I redid anoscopy & examination.  At completion of this, the hemorrhoids were reduced into the rectum.  Measured the closure at 9 to 10 cm from the anal verge.  There is no more hemorrhoidal prolapse.  Internal & external anatomy was more more normal.  Hemostasis was good.  Fluffed gauze was on-laid over the perianal region.  No packing done.  Patient is being extubated go to go to the recovery room.  The patient is being extubated go to recovery room.  I discussed operative findings, updated the patient's status, discussed probable steps to recovery, and gave postoperative recommendations to the patient's daughter, Judson Roch.  Recommendations were made.  Questions  were answered.  She expressed understanding & appreciation.   Adin Hector, MD, FACS, MASCRS Gastrointestinal and Minimally Invasive Surgery    [address] [place], [place] [address] [phone] Main / Paging [phone] Fax

## 2020-08-14 NOTE — Anesthesia Procedure Notes (Signed)
Procedure Name: Intubation Date/Time: 08/14/2020 12:44 PM Performed by: Victoriano Lain, CRNA Pre-anesthesia Checklist: Patient identified, Emergency Drugs available, Suction available, Patient being monitored and Timeout performed Patient Re-evaluated:Patient Re-evaluated prior to induction Oxygen Delivery Method: Circle system utilized Preoxygenation: Pre-oxygenation with 100% oxygen Induction Type: IV induction Ventilation: Mask ventilation without difficulty Laryngoscope Size: Mac and 4 Grade View: Grade I Tube type: Oral Tube size: 7.5 mm Number of attempts: 1 Airway Equipment and Method: Stylet Placement Confirmation: ETT inserted through vocal cords under direct vision,  positive ETCO2 and breath sounds checked- equal and bilateral Secured at: 24 cm Tube secured with: Tape Dental Injury: Teeth and Oropharynx as per pre-operative assessment

## 2020-08-14 NOTE — Anesthesia Postprocedure Evaluation (Signed)
Anesthesia Post Note  Patient: Brent Potter  Procedure(s) Performed: ROBOTIC PARTIAL PROCTECTOMY (TAMIS), RIGID PROCTOSCOPY, HEMORRHOIDAL LIGATION/PEXY (N/A Rectum) HEMORRHOIDECTOMY (N/A )     Patient location during evaluation: PACU Anesthesia Type: General Level of consciousness: awake and alert and oriented Pain management: pain level controlled Vital Signs Assessment: post-procedure vital signs reviewed and stable Respiratory status: spontaneous breathing, nonlabored ventilation and respiratory function stable Cardiovascular status: blood pressure returned to baseline and stable Postop Assessment: no apparent nausea or vomiting Anesthetic complications: no   No complications documented.  Last Vitals:  Vitals:   08/14/20 1630 08/14/20 1645  BP: 123/83 106/83  Pulse: 89 88  Resp: 17 13  Temp:  36.7 C  SpO2: 97% 97%    Last Pain:  Vitals:   08/14/20 1645  TempSrc:   PainSc: 0-No pain                 Anapaola Kinsel A.

## 2020-08-14 NOTE — Transfer of Care (Signed)
Immediate Anesthesia Transfer of Care Note  Patient: Brent Potter  Procedure(s) Performed: ROBOTIC PARTIAL PROCTECTOMY (TAMIS), RIGID PROCTOSCOPY, HEMORRHOIDAL LIGATION/PEXY (N/A Rectum) HEMORRHOIDECTOMY (N/A )  Patient Location: PACU  Anesthesia Type:General  Level of Consciousness: awake, drowsy and patient cooperative  Airway & Oxygen Therapy: Patient Spontanous Breathing and Patient connected to face mask oxygen  Post-op Assessment: Report given to RN and Post -op Vital signs reviewed and stable  Post vital signs: Reviewed and stable  Last Vitals:  Vitals Value Taken Time  BP 112/81 08/14/20 1556  Temp    Pulse 96 08/14/20 1559  Resp 18 08/14/20 1559  SpO2 100 % 08/14/20 1559  Vitals shown include unvalidated device data.  Last Pain:  Vitals:   08/14/20 1002  TempSrc:   PainSc: 0-No pain         Complications: No complications documented.

## 2020-08-14 NOTE — Interval H&P Note (Signed)
History and Physical Interval Note:  08/14/2020 11:52 AM  Brent Potter  has presented today for surgery, with the diagnosis of RECTAL CANCER WITHIN A POLYP.  The various methods of treatment have been discussed with the patient and family. After consideration of risks, benefits and other options for treatment, the patient has consented to  Procedure(s): ROBOTIC TAMIS PARTIAL PROCTECTOMY OF RECTAL MASS (N/A) as a surgical intervention.  The patient's history has been reviewed, patient examined, no change in status, stable for surgery.  I have reviewed the patient's chart and labs.  Questions were answered to the patient's satisfaction.    I have re-reviewed the the patient's records, history, medications, and allergies.  I have re-examined the patient.  I again discussed intraoperative plans and goals of post-operative recovery.  The patient agrees to proceed.  Brent Potter  05/10/1956 [id number]  Patient Care Team: Josetta Huddle, MD as PCP - General (Internal Medicine) Clarene Essex, MD as Consulting Physician (Gastroenterology) Michael Boston, MD as Consulting Physician (General Surgery)  Patient Active Problem List   Diagnosis Date Noted   Hypertension 01/28/2011   Palpitations 01/28/2011    Past Medical History:  Diagnosis Date   Anxiety    HTN (hypertension)    Currently off meds   Palpitations    Phlebitis    due to basketball injury   Pre-diabetes    Pt denies   Rectal cancer [facility])     Past Surgical History:  Procedure Laterality Date   COLONOSCOPY W/ POLYPECTOMY     DOPPLER ECHOCARDIOGRAPHY  08/10/2007   EF 55-60%, impaired LV relataion, trace mitrial & tricuspid regurgitation   WISDOM TOOTH EXTRACTION      Social History   Socioeconomic History   Marital status: Significant Other    Spouse name: Not on file   Number of children: Not on file   Years of education: Not on file   Highest education level: Not on file  Occupational History   Occupation:  clinical psychologist  Tobacco Use   Smoking status: Never Smoker   Smokeless tobacco: Never Used  Vaping Use   Vaping Use: Never used  Substance and Sexual Activity   Alcohol use: No   Drug use: Never   Sexual activity: Not on file  Other Topics Concern   Not on file  Social History Narrative   Not on file   Social Determinants of Health   Financial Resource Strain: Not on file  Food Insecurity: Not on file  Transportation Needs: Not on file  Physical Activity: Not on file  Stress: Not on file  Social Connections: Not on file  Intimate Partner Violence: Not on file    Family History  Problem Relation Age of Onset   Hypertension Father    Thyroid disease Mother     Medications Prior to Admission  Medication Sig Dispense Refill Last Dose   Cholecalciferol (VITAMIN D-3) 125 MCG (5000 UT) TABS Take 5,000 Units by mouth daily at 12 noon.      Coenzyme Q10 (CO Q 10) 100 MG CAPS Take 300 mg by mouth daily.      Hypromell-Glycerin-Naphazoline (CLEAR EYES FOR DRY EYES PLUS) 0.8-0.25-0.012 % SOLN Place 1 drop into both eyes daily as needed (redness for eye).      Multiple Vitamins-Minerals (MULTIVITAMIN WITH MINERALS) tablet Take 1 tablet by mouth daily. Centrum      oxymetazoline (AFRIN) 0.05 % nasal spray Place 1 spray into both nostrils at bedtime as needed for congestion.  Taurine 500 MG CAPS Take 500 mg by mouth daily at 12 noon.      testosterone (ANDROGEL) 50 MG/5GM (1%) GEL Place 5 g onto the skin daily as needed.      TURMERIC PO Take 1,950 mg by mouth daily at 12 noon.       Current Facility-Administered Medications  Medication Dose Route Frequency Provider Last Rate Last Admin   bisacodyl (DULCOLAX) EC tablet 20 mg  20 mg Oral Once Karie SodaGross, Autie Vasudevan, MD       bupivacaine liposome (EXPAREL) 1.3 % injection 266 mg  20 mL Infiltration On Call to OR Karie SodaGross, Jahayra Mazo, MD       cefoTEtan (CEFOTAN) 2 g in sodium chloride 0.9 % 100 mL IVPB  2 g Intravenous On Call to OR Karie SodaGross,  Alese Furniss, MD       Chlorhexidine Gluconate Cloth 2 % PADS 6 each  6 each Topical Once Karie SodaGross, Isabelle Matt, MD       clindamycin (CLEOCIN) 900 mg, gentamicin (GARAMYCIN) 240 mg in sodium chloride 0.9 % 1,000 mL for intraperitoneal lavage   Irrigation To OR Karie SodaGross, Millee Denise, MD       feeding supplement (ENSURE PRE-SURGERY) liquid 296 mL  296 mL Oral Once Karie SodaGross, Lenna Hagarty, MD       lactated ringers infusion   Intravenous Continuous Eilene Ghaziose, George, MD 10 mL/hr at 08/14/20 1004 New Bag at 08/14/20 1004   polyethylene glycol powder (GLYCOLAX/MIRALAX) container 255 g  1 Container Oral Once Karie SodaGross, Ranay Ketter, MD         Allergies  Allergen Reactions   Hctz [Hydrochlorothiazide] Other (See Comments)    dehydrated   Losartan Other (See Comments)    severe fatigue/sex dysfunction    BP (!) 129/91   Pulse 96   Temp 97.9 F (36.6 C) (Oral)   Resp 18   Wt 90.7 kg   SpO2 94%   BMI 24.67 kg/m   Labs: Results for orders placed or performed during the hospital encounter of 08/14/20 (from the past 48 hour(s))  ABO/Rh     Status: None   Collection Time: 08/14/20  9:50 AM  Result Value Ref Range   ABO/RH(D)      O POS Performed at [facility], [address] [place], [place] [address]     Imaging / Studies: CT CHEST W CONTRAST  Result Date: 07/19/2020 CLINICAL DATA:  Patient with history of rectal adenocarcinoma diagnosed on colonoscopy. EXAM: CT CHEST, ABDOMEN, AND PELVIS WITH CONTRAST TECHNIQUE: Multidetector CT imaging of the chest, abdomen and pelvis was performed following the standard protocol during bolus administration of intravenous contrast. CONTRAST:  100mL OMNIPAQUE IOHEXOL 300 MG/ML  SOLN COMPARISON:  None. FINDINGS: CT CHEST FINDINGS Cardiovascular: Normal heart size. Aorta main pulmonary artery normal in caliber. Thoracic aortic vascular calcifications. Mediastinum/Nodes: No enlarged axillary, mediastinal or hilar lymphadenopathy. Normal appearance of the esophagus. Lungs/Pleura:  Central airways are patent. No large area of pulmonary consolidation. No pleural effusion or pneumothorax. Musculoskeletal: Thoracic spine degenerative changes. No aggressive or acute appearing osseous lesions. CT ABDOMEN PELVIS FINDINGS Hepatobiliary: Liver is normal in size and contour. No focal lesion identified. Small stone in the gallbladder lumen. No intrahepatic or extrahepatic biliary ductal dilatation. Pancreas: Unremarkable Spleen: Unremarkable Adrenals/Urinary Tract: Normal adrenal glands. Kidneys enhance symmetrically with contrast. No hydronephrosis. Urinary bladder is unremarkable. Stomach/Bowel: Sigmoid and descending colonic diverticulosis. No CT evidence for acute diverticulitis. No evidence for bowel obstruction. No free fluid or free intraperitoneal air. Normal morphology of the stomach.  Vascular/Lymphatic: Normal caliber abdominal aorta. Peripheral calcified atherosclerotic plaque. No retroperitoneal or pelvic lymphadenopathy. Reproductive: Heterogeneous prostate. Other: Small bilateral fat containing inguinal hernias. Musculoskeletal: Lumbar spine degenerative changes. Chronic appearing left inferior pubic ramus fracture with callus formation. IMPRESSION: 1. No evidence for metastatic disease in the chest, abdomen or pelvis. Electronically Signed   By: Lovey Newcomer M.D.   On: 07/19/2020 16:47   CT ABDOMEN PELVIS W CONTRAST  Result Date: 07/19/2020 CLINICAL DATA:  Patient with history of rectal adenocarcinoma diagnosed on colonoscopy. EXAM: CT CHEST, ABDOMEN, AND PELVIS WITH CONTRAST TECHNIQUE: Multidetector CT imaging of the chest, abdomen and pelvis was performed following the standard protocol during bolus administration of intravenous contrast. CONTRAST:  152mL OMNIPAQUE IOHEXOL 300 MG/ML  SOLN COMPARISON:  None. FINDINGS: CT CHEST FINDINGS Cardiovascular: Normal heart size. Aorta main pulmonary artery normal in caliber. Thoracic aortic vascular calcifications. Mediastinum/Nodes: No  enlarged axillary, mediastinal or hilar lymphadenopathy. Normal appearance of the esophagus. Lungs/Pleura: Central airways are patent. No large area of pulmonary consolidation. No pleural effusion or pneumothorax. Musculoskeletal: Thoracic spine degenerative changes. No aggressive or acute appearing osseous lesions. CT ABDOMEN PELVIS FINDINGS Hepatobiliary: Liver is normal in size and contour. No focal lesion identified. Small stone in the gallbladder lumen. No intrahepatic or extrahepatic biliary ductal dilatation. Pancreas: Unremarkable Spleen: Unremarkable Adrenals/Urinary Tract: Normal adrenal glands. Kidneys enhance symmetrically with contrast. No hydronephrosis. Urinary bladder is unremarkable. Stomach/Bowel: Sigmoid and descending colonic diverticulosis. No CT evidence for acute diverticulitis. No evidence for bowel obstruction. No free fluid or free intraperitoneal air. Normal morphology of the stomach. Vascular/Lymphatic: Normal caliber abdominal aorta. Peripheral calcified atherosclerotic plaque. No retroperitoneal or pelvic lymphadenopathy. Reproductive: Heterogeneous prostate. Other: Small bilateral fat containing inguinal hernias. Musculoskeletal: Lumbar spine degenerative changes. Chronic appearing left inferior pubic ramus fracture with callus formation. IMPRESSION: 1. No evidence for metastatic disease in the chest, abdomen or pelvis. Electronically Signed   By: Lovey Newcomer M.D.   On: 07/19/2020 16:47     .Adin Hector, M.D., F.A.C.S. Gastrointestinal and Minimally Invasive Surgery [facility] [address], [place], [place] [address] [phone] Main / Paging  08/14/2020 11:52 AM    Adin Hector

## 2020-08-15 ENCOUNTER — Encounter (HOSPITAL_COMMUNITY): Payer: Self-pay | Admitting: Surgery

## 2020-08-15 LAB — CBC
HCT: 44.4 % (ref 39.0–52.0)
Hemoglobin: 15.5 g/dL (ref 13.0–17.0)
MCH: 30.8 pg (ref 26.0–34.0)
MCHC: 34.9 g/dL (ref 30.0–36.0)
MCV: 88.1 fL (ref 80.0–100.0)
Platelets: 189 10*3/uL (ref 150–400)
RBC: 5.04 MIL/uL (ref 4.22–5.81)
RDW: 12.3 % (ref 11.5–15.5)
WBC: 12.3 10*3/uL — ABNORMAL HIGH (ref 4.0–10.5)
nRBC: 0 % (ref 0.0–0.2)

## 2020-08-15 LAB — BASIC METABOLIC PANEL
Anion gap: 9 (ref 5–15)
BUN: 15 mg/dL (ref 8–23)
CO2: 28 mmol/L (ref 22–32)
Calcium: 9.4 mg/dL (ref 8.9–10.3)
Chloride: 104 mmol/L (ref 98–111)
Creatinine, Ser: 1.05 mg/dL (ref 0.61–1.24)
GFR, Estimated: >60 mL/min (ref >60)
Glucose, Bld: 110 mg/dL — ABNORMAL HIGH (ref 70–99)
Potassium: 4.2 mmol/L (ref 3.5–5.1)
Sodium: 141 mmol/L (ref 135–145)

## 2020-08-15 LAB — MAGNESIUM: Magnesium: 2.2 mg/dL (ref 1.7–2.4)

## 2020-08-15 NOTE — Discharge Summary (Signed)
Physician Discharge Summary [facility]  Patient ID: Brent Potter MRN: [id number] DOB/AGE: 1956/04/20 65 y.o.  Admit date: 08/14/2020  Discharge date: 08/15/2020  Discharge Diagnoses:  Principal Problem:   Adenocarcinoma of rectum within polyp s/p TAMIS partial proctectomy 08/14/2020 Active Problems:   Essential hypertension   Palpitations   Prolapsed internal hemorrhoids, grade 3, s/p ligation & pexy 08/14/2020   Episodic memory loss   Discharged Condition: good  Hospital Course: Patient was admitted for observation following transanal resection of malignant polyp surgery.  Post op course was uncomplicated.  Pain was well controlled.  Tolerated diet. Small bleeding per rectum decreasing with each dressing change.  Patient was prepared for discharge home on POD#1.  Consults: None  Treatments: surgery: TAMIS partial proctectomy 08/14/2020  Discharge Exam: Blood pressure 116/83, pulse 85, temperature 98.3 F (36.8 C), temperature source Oral, resp. rate 18, height 6' 3.5" (1.918 m), weight 90.7 kg, SpO2 98 %. HEENT - clear Abd - soft, non-tender  Disposition: Home  Discharge Instructions    Call MD for:  hives   Complete by: As directed    Call MD for:  persistant dizziness or light-headedness   Complete by: As directed    Call MD for:  persistant nausea and vomiting   Complete by: As directed    Call MD for:  redness, tenderness, or signs of infection (pain, swelling, redness, odor or green/yellow discharge around incision site)   Complete by: As directed    You will often notice bleeding with bowel movements.  Expect some yellow or tan drainage.  This can occur for weeks, but should be mild by the end of the first week of surgery.  Wear an absorbent pad or soft cotton gauze in your underwear until the drainage stops   Call MD for:  severe uncontrolled pain   Complete by: As directed    Diet - low sodium heart healthy   Complete by: As directed     Diet - low sodium heart healthy   Complete by: As directed    Discharge instructions   Complete by: As directed    See Rectal Surgery instruction sheet   Discharge wound care:   Complete by: As directed    -Allow any ribbon or fluffed gauze to fall out with the 1st bowel movement -Bathe / shower every day.  Just warm water.  Avoid salts/soaps.  Keep the area clean by showering / bathing over the incision / wound.   It is okay to soak an open wound to help wash it.   -Wet wipes or showers / gentle washing after bowel movements is often less traumatic than regular toilet paper.  Consider using a squeeze bottle of warm water to rinse the perianal region. -You will notice bleeding & yellow drainage with bowel movements.  This should slow down by the end of the first week of surgery, but expect some drainage for many weeks.  Wear an absorbent pad or soft cotton gauze in your underwear as needed to catch any drainage and help keep the area clean and dry.   Driving Restrictions   Complete by: As directed    You may drive when you are no longer taking prescription pain medication, you can comfortably sit for long periods of time, and you can safely maneuver your car and apply brakes.   Increase activity slowly   Complete by: As directed    Increase activity slowly   Complete by: As directed  Lifting restrictions   Complete by: As directed    You may resume regular (light) daily activities beginning the next day-such as daily self-care, walking, climbing stairs-gradually increasing activities as tolerated.  If you can walk 30 minutes without difficulty, it is safe to try more intense activity such as jogging, treadmill, bicycling, low-impact aerobics, swimming, etc. Save the most intensive and strenuous activity for last such as sit-ups, heavy lifting, contact sports, etc  Refrain from any heavy lifting or straining until you are off narcotics for pain control.  Remember: if it hurts to do it,  don't do it: STOP   May shower / Bathe   Complete by: As directed    Warm water sitz baths/tub soaks x 20-30 minutes, 4-8 times a day for comfort   May walk up steps   Complete by: As directed    No wound care   Complete by: As directed    Sexual Activity Restrictions   Complete by: As directed    You may have sexual intercourse when it is comfortable. If it hurts to do it, STOP.     Allergies as of 08/15/2020      Reactions   Hctz [hydrochlorothiazide] Other (See Comments)   dehydrated   Losartan Other (See Comments)   severe fatigue/sex dysfunction      Medication List    TAKE these medications   Clear Eyes for Dry Eyes Plus 0.8-0.25-0.012 % Soln Generic drug: Hypromell-Glycerin-Naphazoline Place 1 drop into both eyes daily as needed (redness for eye).   Co Q 10 100 MG Caps Take 300 mg by mouth daily.   diazepam 5 MG tablet Commonly known as: VALIUM Take 1 tablet (5 mg total) by mouth every 6 (six) hours as needed for muscle spasms (difficulty urinating).   multivitamin with minerals tablet Take 1 tablet by mouth daily. Centrum   naproxen 500 MG tablet Commonly known as: NAPROSYN Take 1 tablet (500 mg total) by mouth every 12 (twelve) hours as needed for mild pain or moderate pain.   oxyCODONE 5 MG immediate release tablet Commonly known as: Oxy IR/ROXICODONE Take 1-2 tablets (5-10 mg total) by mouth every 6 (six) hours as needed for moderate pain, severe pain or breakthrough pain.   oxymetazoline 0.05 % nasal spray Commonly known as: AFRIN Place 1 spray into both nostrils at bedtime as needed for congestion.   Taurine 500 MG Caps Take 500 mg by mouth daily at 12 noon.   testosterone 50 MG/5GM (1%) Gel Commonly known as: ANDROGEL Place 5 g onto the skin daily as needed.   TURMERIC PO Take 1,950 mg by mouth daily at 12 noon.   Vitamin D-3 125 MCG (5000 UT) Tabs Take 5,000 Units by mouth daily at 12 noon.            Discharge Care Instructions   (From admission, onward)         Start     Ordered   08/14/20 0000  Discharge wound care:       Comments: -Allow any ribbon or fluffed gauze to fall out with the 1st bowel movement -Bathe / shower every day.  Just warm water.  Avoid salts/soaps.  Keep the area clean by showering / bathing over the incision / wound.   It is okay to soak an open wound to help wash it.   -Wet wipes or showers / gentle washing after bowel movements is often less traumatic than regular toilet paper.  Consider using a squeeze bottle  of warm water to rinse the perianal region. -You will notice bleeding & yellow drainage with bowel movements.  This should slow down by the end of the first week of surgery, but expect some drainage for many weeks.  Wear an absorbent pad or soft cotton gauze in your underwear as needed to catch any drainage and help keep the area clean and dry.   08/14/20 1627          Follow-up Information    Michael Boston, MD. Schedule an appointment as soon as possible for a visit in 3 weeks.   Specialty: General Surgery Why: To follow up after your operation, To follow up after your hospital stay Contact information: [address] [place] [address] [phone]               Morris Markham, [facility] Office: [phone]   Signed: Armandina Gemma 08/15/2020, 9:23 AM

## 2020-08-15 NOTE — Plan of Care (Signed)
Reviewed d/c instructions w pt and all questions answered. Pt verb understanding. Pt d/c via w/c w all of his belongings in stable condition.

## 2020-08-18 LAB — SURGICAL PATHOLOGY

## 2020-09-25 DIAGNOSIS — L718 Other rosacea: Secondary | ICD-10-CM | POA: Diagnosis not present

## 2020-09-25 DIAGNOSIS — L814 Other melanin hyperpigmentation: Secondary | ICD-10-CM | POA: Diagnosis not present

## 2020-09-25 DIAGNOSIS — Z85828 Personal history of other malignant neoplasm of skin: Secondary | ICD-10-CM | POA: Diagnosis not present

## 2020-09-25 DIAGNOSIS — L821 Other seborrheic keratosis: Secondary | ICD-10-CM | POA: Diagnosis not present

## 2020-09-25 DIAGNOSIS — L570 Actinic keratosis: Secondary | ICD-10-CM | POA: Diagnosis not present

## 2020-10-13 DIAGNOSIS — F0390 Unspecified dementia without behavioral disturbance: Secondary | ICD-10-CM | POA: Diagnosis not present

## 2020-10-13 DIAGNOSIS — G309 Alzheimer's disease, unspecified: Secondary | ICD-10-CM | POA: Diagnosis not present

## 2020-10-13 DIAGNOSIS — F0280 Dementia in other diseases classified elsewhere without behavioral disturbance: Secondary | ICD-10-CM | POA: Diagnosis not present

## 2020-10-27 DIAGNOSIS — C20 Malignant neoplasm of rectum: Secondary | ICD-10-CM | POA: Diagnosis not present

## 2021-02-18 DIAGNOSIS — J310 Chronic rhinitis: Secondary | ICD-10-CM | POA: Diagnosis not present

## 2021-02-18 DIAGNOSIS — T485X5A Adverse effect of other anti-common-cold drugs, initial encounter: Secondary | ICD-10-CM | POA: Diagnosis not present

## 2021-02-23 DIAGNOSIS — J343 Hypertrophy of nasal turbinates: Secondary | ICD-10-CM | POA: Diagnosis not present

## 2021-03-12 DIAGNOSIS — J309 Allergic rhinitis, unspecified: Secondary | ICD-10-CM | POA: Diagnosis not present

## 2021-04-22 DIAGNOSIS — Z820 Family history of epilepsy and other diseases of the nervous system: Secondary | ICD-10-CM | POA: Diagnosis not present

## 2021-04-22 DIAGNOSIS — F411 Generalized anxiety disorder: Secondary | ICD-10-CM | POA: Diagnosis not present

## 2021-04-22 DIAGNOSIS — R413 Other amnesia: Secondary | ICD-10-CM | POA: Diagnosis not present

## 2021-04-22 DIAGNOSIS — F0280 Dementia in other diseases classified elsewhere without behavioral disturbance: Secondary | ICD-10-CM | POA: Diagnosis not present

## 2021-04-22 DIAGNOSIS — G309 Alzheimer's disease, unspecified: Secondary | ICD-10-CM | POA: Diagnosis not present

## 2021-05-07 DIAGNOSIS — G300 Alzheimer's disease with early onset: Secondary | ICD-10-CM | POA: Diagnosis not present

## 2021-05-07 DIAGNOSIS — F0280 Dementia in other diseases classified elsewhere without behavioral disturbance: Secondary | ICD-10-CM | POA: Diagnosis not present

## 2021-05-07 DIAGNOSIS — G319 Degenerative disease of nervous system, unspecified: Secondary | ICD-10-CM | POA: Diagnosis not present

## 2021-06-16 DIAGNOSIS — L821 Other seborrheic keratosis: Secondary | ICD-10-CM | POA: Diagnosis not present

## 2021-06-16 DIAGNOSIS — D2372 Other benign neoplasm of skin of left lower limb, including hip: Secondary | ICD-10-CM | POA: Diagnosis not present

## 2021-06-16 DIAGNOSIS — L570 Actinic keratosis: Secondary | ICD-10-CM | POA: Diagnosis not present

## 2021-06-16 DIAGNOSIS — Z85828 Personal history of other malignant neoplasm of skin: Secondary | ICD-10-CM | POA: Diagnosis not present

## 2021-07-07 DIAGNOSIS — R413 Other amnesia: Secondary | ICD-10-CM | POA: Diagnosis not present

## 2021-07-07 DIAGNOSIS — Z79899 Other long term (current) drug therapy: Secondary | ICD-10-CM | POA: Diagnosis not present

## 2021-07-13 DIAGNOSIS — E291 Testicular hypofunction: Secondary | ICD-10-CM | POA: Diagnosis not present

## 2021-07-13 DIAGNOSIS — F32A Depression, unspecified: Secondary | ICD-10-CM | POA: Diagnosis not present

## 2021-07-13 DIAGNOSIS — G300 Alzheimer's disease with early onset: Secondary | ICD-10-CM | POA: Diagnosis not present

## 2021-08-04 DIAGNOSIS — Z86010 Personal history of colonic polyps: Secondary | ICD-10-CM | POA: Diagnosis not present

## 2021-08-04 DIAGNOSIS — C20 Malignant neoplasm of rectum: Secondary | ICD-10-CM | POA: Diagnosis not present

## 2021-08-18 DIAGNOSIS — G300 Alzheimer's disease with early onset: Secondary | ICD-10-CM | POA: Diagnosis not present

## 2021-08-18 DIAGNOSIS — I10 Essential (primary) hypertension: Secondary | ICD-10-CM | POA: Diagnosis not present

## 2021-08-18 DIAGNOSIS — F32A Depression, unspecified: Secondary | ICD-10-CM | POA: Diagnosis not present

## 2021-08-20 DIAGNOSIS — K5730 Diverticulosis of large intestine without perforation or abscess without bleeding: Secondary | ICD-10-CM | POA: Diagnosis not present

## 2021-08-20 DIAGNOSIS — K649 Unspecified hemorrhoids: Secondary | ICD-10-CM | POA: Diagnosis not present

## 2021-08-20 DIAGNOSIS — Z980 Intestinal bypass and anastomosis status: Secondary | ICD-10-CM | POA: Diagnosis not present

## 2021-08-20 DIAGNOSIS — Z85048 Personal history of other malignant neoplasm of rectum, rectosigmoid junction, and anus: Secondary | ICD-10-CM | POA: Diagnosis not present

## 2021-08-20 DIAGNOSIS — D123 Benign neoplasm of transverse colon: Secondary | ICD-10-CM | POA: Diagnosis not present

## 2021-08-20 DIAGNOSIS — Z86010 Personal history of colonic polyps: Secondary | ICD-10-CM | POA: Diagnosis not present

## 2021-08-24 DIAGNOSIS — D123 Benign neoplasm of transverse colon: Secondary | ICD-10-CM | POA: Diagnosis not present

## 2021-09-20 DIAGNOSIS — Z85828 Personal history of other malignant neoplasm of skin: Secondary | ICD-10-CM | POA: Diagnosis not present

## 2021-09-20 DIAGNOSIS — L570 Actinic keratosis: Secondary | ICD-10-CM | POA: Diagnosis not present

## 2021-10-05 DIAGNOSIS — F0280 Dementia in other diseases classified elsewhere without behavioral disturbance: Secondary | ICD-10-CM | POA: Diagnosis not present

## 2021-10-05 DIAGNOSIS — J343 Hypertrophy of nasal turbinates: Secondary | ICD-10-CM | POA: Diagnosis not present

## 2021-10-05 DIAGNOSIS — G300 Alzheimer's disease with early onset: Secondary | ICD-10-CM | POA: Diagnosis not present

## 2021-10-05 DIAGNOSIS — J310 Chronic rhinitis: Secondary | ICD-10-CM | POA: Diagnosis not present

## 2021-10-07 DIAGNOSIS — F32A Depression, unspecified: Secondary | ICD-10-CM | POA: Diagnosis not present

## 2021-10-07 DIAGNOSIS — I10 Essential (primary) hypertension: Secondary | ICD-10-CM | POA: Diagnosis not present

## 2021-10-07 DIAGNOSIS — E559 Vitamin D deficiency, unspecified: Secondary | ICD-10-CM | POA: Diagnosis not present

## 2021-10-07 DIAGNOSIS — E291 Testicular hypofunction: Secondary | ICD-10-CM | POA: Diagnosis not present

## 2021-10-07 DIAGNOSIS — Z0001 Encounter for general adult medical examination with abnormal findings: Secondary | ICD-10-CM | POA: Diagnosis not present

## 2021-10-07 DIAGNOSIS — G300 Alzheimer's disease with early onset: Secondary | ICD-10-CM | POA: Diagnosis not present

## 2021-10-07 DIAGNOSIS — E782 Mixed hyperlipidemia: Secondary | ICD-10-CM | POA: Diagnosis not present

## 2022-03-02 DIAGNOSIS — G300 Alzheimer's disease with early onset: Secondary | ICD-10-CM | POA: Diagnosis not present

## 2022-03-02 DIAGNOSIS — F32A Depression, unspecified: Secondary | ICD-10-CM | POA: Diagnosis not present

## 2022-03-02 DIAGNOSIS — I10 Essential (primary) hypertension: Secondary | ICD-10-CM | POA: Diagnosis not present

## 2022-03-02 DIAGNOSIS — K4090 Unilateral inguinal hernia, without obstruction or gangrene, not specified as recurrent: Secondary | ICD-10-CM | POA: Diagnosis not present

## 2022-04-01 ENCOUNTER — Other Ambulatory Visit: Payer: Self-pay | Admitting: Surgery

## 2022-04-01 DIAGNOSIS — K4090 Unilateral inguinal hernia, without obstruction or gangrene, not specified as recurrent: Secondary | ICD-10-CM | POA: Diagnosis not present

## 2022-04-18 IMAGING — CT CT ABD-PELV W/ CM
2 of 5 series · 14 of 46 positions shown, 16 images · IV contrast (omnipaque)
Comparison: None.

CLINICAL DATA: Patient with history of rectal adenocarcinoma
diagnosed on colonoscopy.

EXAM:
CT CHEST, ABDOMEN, AND PELVIS WITH CONTRAST
TECHNIQUE: Multidetector CT imaging of the chest, abdomen and pelvis was
performed following the standard protocol during bolus
administration of intravenous contrast.
CONTRAST:  100mL OMNIPAQUE IOHEXOL 300 MG/ML  SOLN

[Series 3: cap with · axial · 0.77mm/px · z∈[+784,+1384]mm · 11 of 144 slices shown, 13 images]
[im 12/144  soft-tissue]
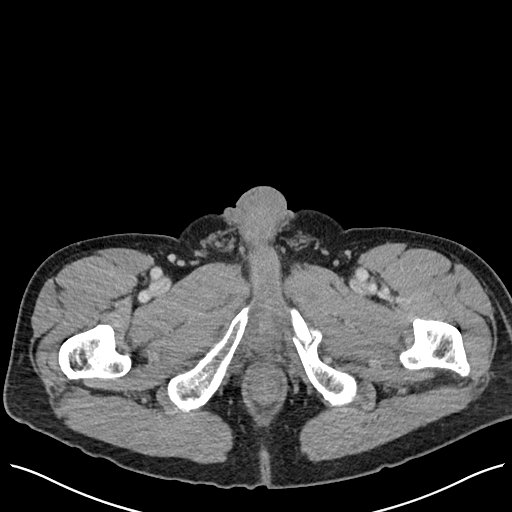
[im 12/144  bone]
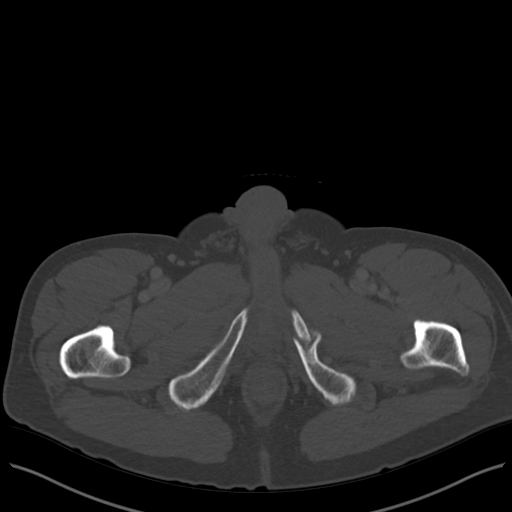
[im 24/144  soft-tissue]
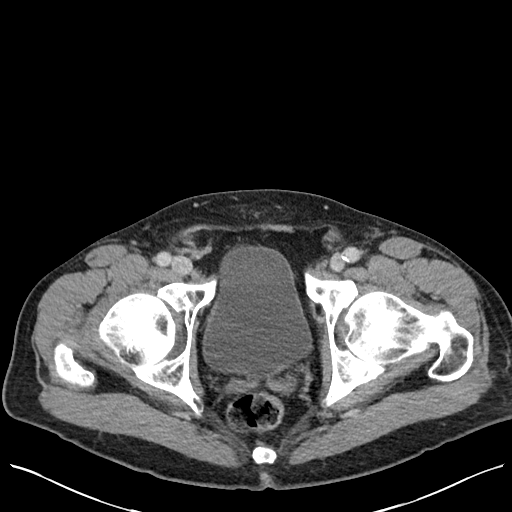
[im 36/144  soft-tissue]
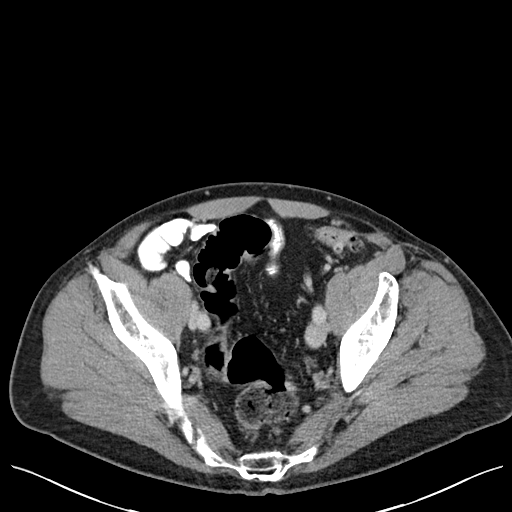
[im 48/144  soft-tissue]
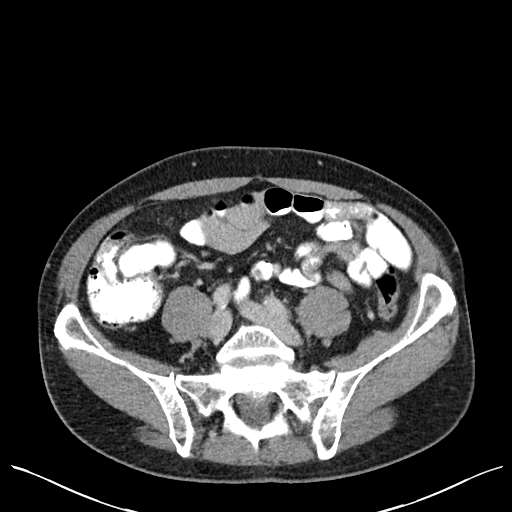
[im 60/144  soft-tissue]
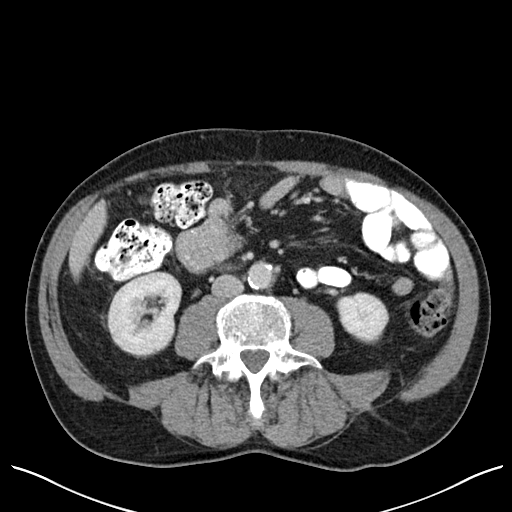
[im 72/144  soft-tissue]
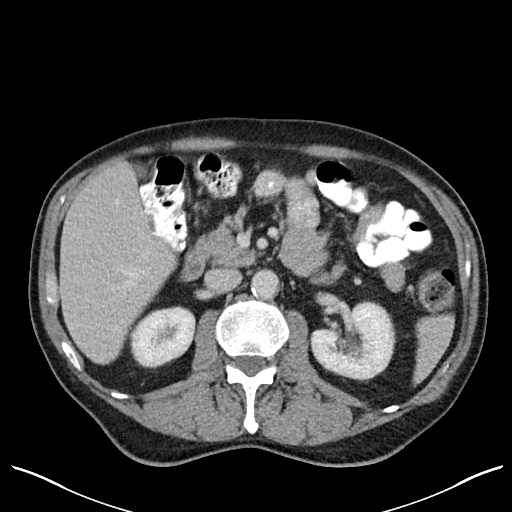
[im 84/144  soft-tissue]
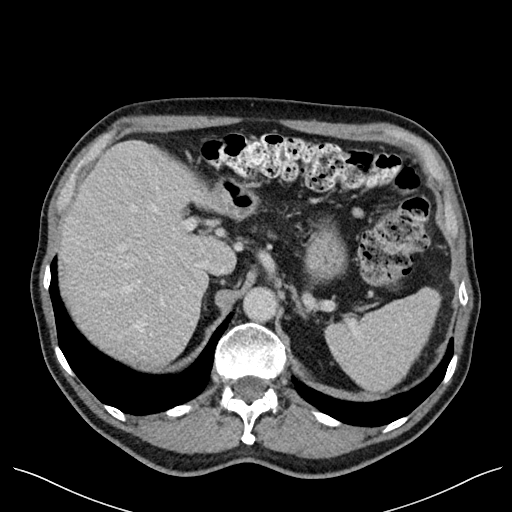
[im 96/144  soft-tissue]
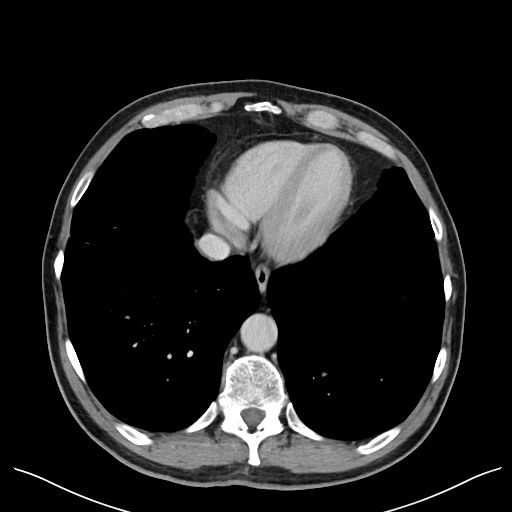
[im 108/144  soft-tissue]
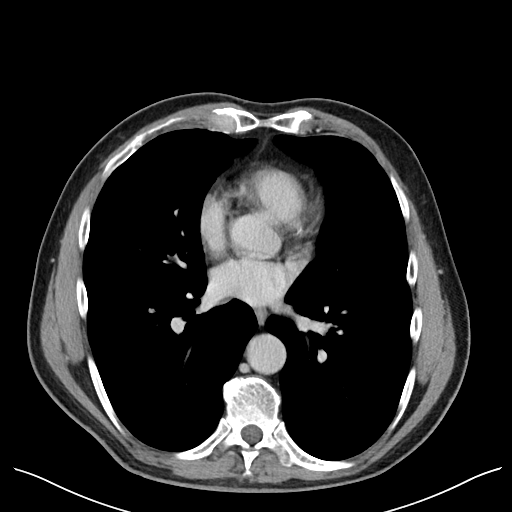
[im 108/144  bone]
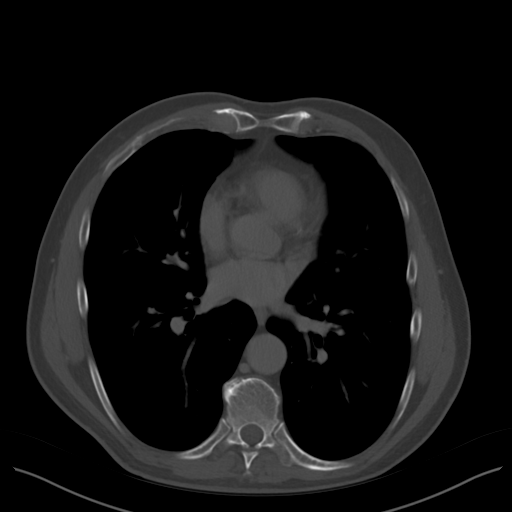
[im 120/144  soft-tissue]
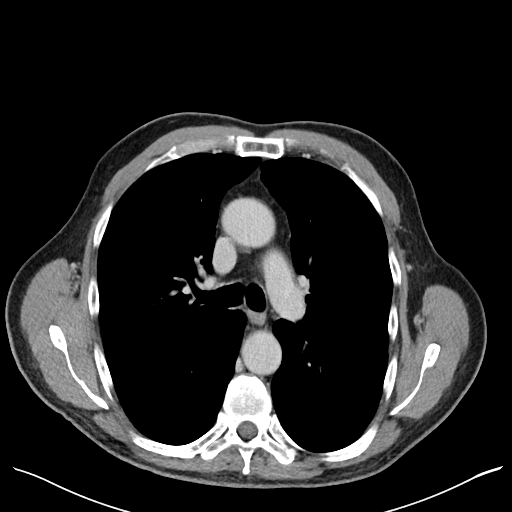
[im 132/144  soft-tissue]
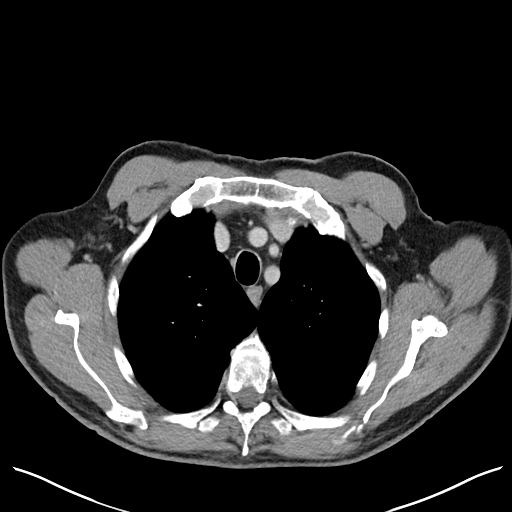

[Series 6: cor · coronal · 0.82mm/px · 3 of 109 slices shown]
[im 37/109  soft-tissue]
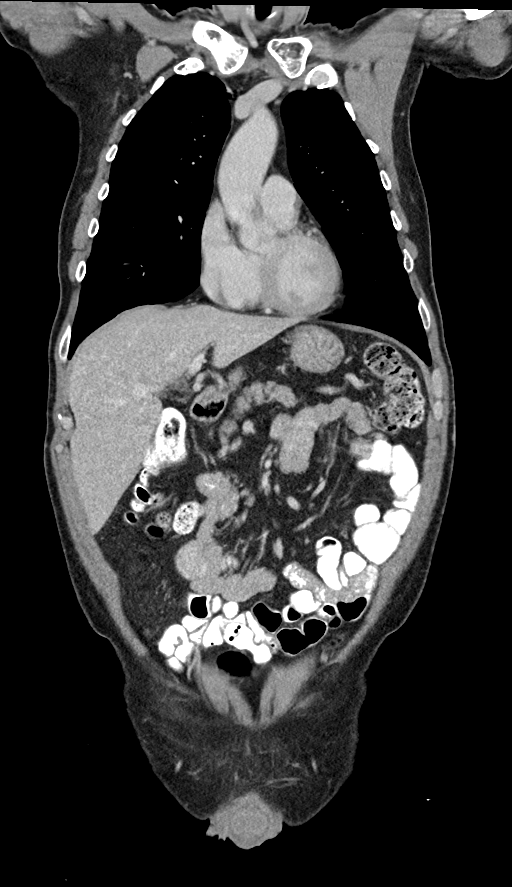
[im 49/109  soft-tissue]
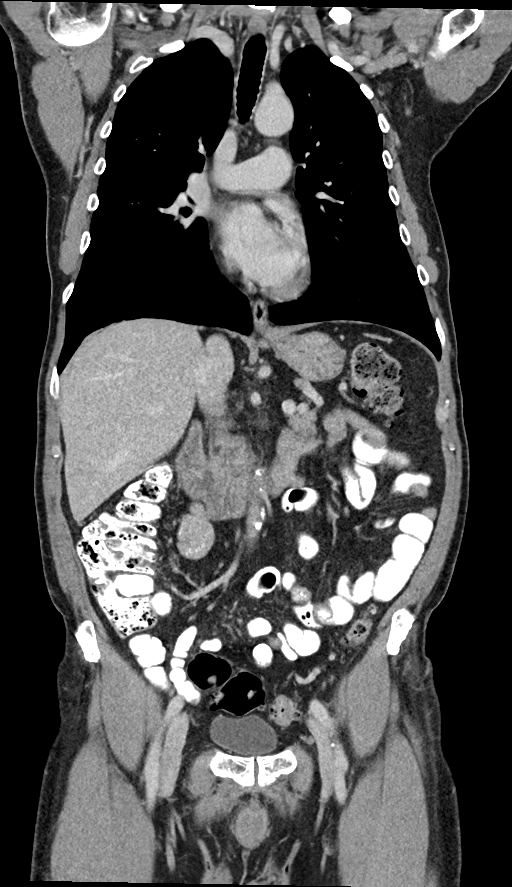
[im 61/109  soft-tissue]
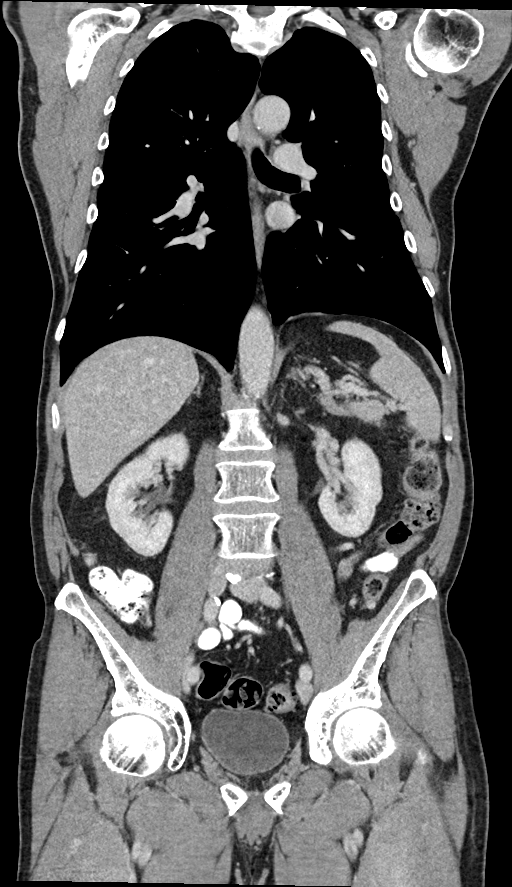

[14 of 46 positions shown; findings below may reference images not displayed]

FINDINGS: CT CHEST FINDINGS

Cardiovascular: Normal heart size. Aorta main pulmonary artery
normal in caliber. Thoracic aortic vascular calcifications.

Mediastinum/Nodes: No enlarged axillary, mediastinal or hilar
lymphadenopathy. Normal appearance of the esophagus.

Lungs/Pleura: Central airways are patent. No large area of pulmonary
consolidation. No pleural effusion or pneumothorax.

Musculoskeletal: Thoracic spine degenerative changes. No aggressive
or acute appearing osseous lesions.

CT ABDOMEN PELVIS FINDINGS

Hepatobiliary: Liver is normal in size and contour. No focal lesion
identified. Small stone in the gallbladder lumen. No intrahepatic or
extrahepatic biliary ductal dilatation.

Pancreas: Unremarkable

Spleen: Unremarkable

Adrenals/Urinary Tract: Normal adrenal glands. Kidneys enhance
symmetrically with contrast. No hydronephrosis. Urinary bladder is
unremarkable.

Stomach/Bowel: Sigmoid and descending colonic diverticulosis. No CT
evidence for acute diverticulitis. No evidence for bowel
obstruction. No free fluid or free intraperitoneal air. Normal
morphology of the stomach.

Vascular/Lymphatic: Normal caliber abdominal aorta. Peripheral
calcified atherosclerotic plaque. No retroperitoneal or pelvic
lymphadenopathy.

Reproductive: Heterogeneous prostate.

Other: Small bilateral fat containing inguinal hernias.

Musculoskeletal: Lumbar spine degenerative changes. Chronic
appearing left inferior pubic ramus fracture with callus formation.
IMPRESSION: 1. No evidence for metastatic disease in the chest, abdomen or
pelvis.

## 2022-04-26 DIAGNOSIS — R030 Elevated blood-pressure reading, without diagnosis of hypertension: Secondary | ICD-10-CM | POA: Diagnosis not present

## 2022-04-26 DIAGNOSIS — R059 Cough, unspecified: Secondary | ICD-10-CM | POA: Diagnosis not present

## 2022-04-26 DIAGNOSIS — J018 Other acute sinusitis: Secondary | ICD-10-CM | POA: Diagnosis not present

## 2022-04-26 DIAGNOSIS — R0602 Shortness of breath: Secondary | ICD-10-CM | POA: Diagnosis not present

## 2022-05-02 ENCOUNTER — Other Ambulatory Visit (HOSPITAL_COMMUNITY): Payer: BC Managed Care – PPO

## 2022-05-03 ENCOUNTER — Telehealth: Payer: Self-pay

## 2022-05-03 NOTE — Patient Outreach (Signed)
  Care Coordination   05/03/2022 Name: DAMIEN BATTY MRN: [id number] DOB: Jan 31, 1956   Care Coordination Outreach Attempts:  An unsuccessful telephone outreach was attempted today to offer the patient information about available care coordination services as a benefit of their health plan.   Follow Up Plan:  Additional outreach attempts will be made to offer the patient care coordination information and services.   Encounter Outcome:  No Answer  Care Coordination Interventions Activated:  No   Care Coordination Interventions:  No, not indicated    [facility] [phone]

## 2022-05-10 NOTE — Progress Notes (Signed)
Surgical Instructions    Your procedure is scheduled on Tuesday, 05/17/22.  Report to [facility] Main Entrance "A" at 5:30 A.M., then check in with the Admitting office.  Call this number if you have problems the morning of surgery:  [phone]   If you have any questions prior to your surgery date call [phone]: Open Monday-Friday 8am-4pm If you experience any cold or flu symptoms such as cough, fever, chills, shortness of breath, etc. between now and your scheduled surgery, please notify us at the above number     Remember:  Do not eat after midnight the night before your surgery  You may drink clear liquids until 4:30am the morning of your surgery.   Clear liquids allowed are: Water, Non-Citrus Juices (without pulp), Carbonated Beverages, Clear Tea, Black Coffee ONLY (NO MILK, CREAM OR POWDERED CREAMER of any kind), and Gatorade  Patient Instructions  The night before surgery:  No food after midnight. ONLY clear liquids after midnight  The day of surgery (if you do NOT have diabetes):  Drink ONE (1) Pre-Surgery Clear Ensure by 4:30am the morning of surgery. Drink in one sitting. Do not sip.  This drink was given to you during your hospital  pre-op appointment visit. Nothing else to drink after completing the  Pre-Surgery Clear Ensure.           If you have questions, please contact your surgeon's office.     Take these medicines the morning of surgery with A SIP OF WATER:  DULoxetine (CYMBALTA) memantine (NAMENDA)   As of today, STOP taking any Aspirin (unless otherwise instructed by your surgeon) Aleve, Naproxen, Ibuprofen, Motrin, Advil, Goody's, BC's, all herbal medications, fish oil, and all vitamins.           Do not wear jewelry or makeup. Do not wear lotions, powders, cologne or deodorant. Men may shave face and neck. Do not bring valuables to the hospital. Do not wear nail polish, gel polish, artificial nails, or any other type of covering on natural  nails (fingers and toes) If you have artificial nails or gel coating that need to be removed by a nail salon, please have this removed prior to surgery. Artificial nails or gel coating may interfere with anesthesia's ability to adequately monitor your vital signs.  [facility] is not responsible for any belongings or valuables.    Do NOT Smoke (Tobacco/Vaping)  24 hours prior to your procedure  If you use a CPAP at night, you may bring your mask for your overnight stay.   Contacts, glasses, hearing aids, dentures or partials may not be worn into surgery, please bring cases for these belongings   For patients admitted to the hospital, discharge time will be determined by your treatment team.   Patients discharged the day of surgery will not be allowed to drive home, and someone needs to stay with them for 24 hours.   SURGICAL WAITING ROOM VISITATION Patients having surgery or a procedure may have no more than 2 support people in the waiting area - these visitors may rotate.   Children under the age of 18 must have an adult with them who is not the patient. If the patient needs to stay at the hospital during part of their recovery, the visitor guidelines for inpatient rooms apply. Pre-op nurse will coordinate an appropriate time for 1 support person to accompany patient in pre-op.  This support person may not rotate.   Please refer to the [facility] website for the visitor  guidelines for Inpatients (after your surgery is over and you are in a regular room).    Special instructions:    Oral Hygiene is also important to reduce your risk of infection.  Remember - BRUSH YOUR TEETH THE MORNING OF SURGERY WITH YOUR REGULAR TOOTHPASTE   - Preparing For Surgery  Before surgery, you can play an important role. Because skin is not sterile, your skin needs to be as free of germs as possible. You can reduce the number of germs on your skin by washing with CHG (chlorahexidine gluconate)  Soap before surgery.  CHG is an antiseptic cleaner which kills germs and bonds with the skin to continue killing germs even after washing.     Please do not use if you have an allergy to CHG or antibacterial soaps. If your skin becomes reddened/irritated stop using the CHG.  Do not shave (including legs and underarms) for at least 48 hours prior to first CHG shower. It is OK to shave your face.  Please follow these instructions carefully.     Shower the NIGHT BEFORE SURGERY and the MORNING OF SURGERY with CHG Soap.   If you chose to wash your hair, wash your hair first as usual with your normal shampoo. After you shampoo, rinse your hair and body thoroughly to remove the shampoo.  Then [employer] and genitals (private parts) with your normal soap and rinse thoroughly to remove soap.  After that Use CHG Soap as you would any other liquid soap. You can apply CHG directly to the skin and wash gently with a scrungie or a clean washcloth.   Apply the CHG Soap to your body ONLY FROM THE NECK DOWN.  Do not use on open wounds or open sores. Avoid contact with your eyes, ears, mouth and genitals (private parts). Wash Face and genitals (private parts)  with your normal soap.   Wash thoroughly, paying special attention to the area where your surgery will be performed.  Thoroughly rinse your body with warm water from the neck down.  DO NOT shower/wash with your normal soap after using and rinsing off the CHG Soap.  Pat yourself dry with a CLEAN TOWEL.  Wear CLEAN PAJAMAS to bed the night before surgery  Place CLEAN SHEETS on your bed the night before your surgery  DO NOT SLEEP WITH PETS.   Day of Surgery: Take a shower with CHG soap. Wear Clean/Comfortable clothing the morning of surgery Do not apply any deodorants/lotions.   Remember to brush your teeth WITH YOUR REGULAR TOOTHPASTE.    If you received a COVID test during your pre-op visit, it is requested that you wear a mask when out in  public, stay away from anyone that may not be feeling well, and notify your surgeon if you develop symptoms. If you have been in contact with anyone that has tested positive in the last 10 days, please notify your surgeon.    Please read over the following fact sheets that you were given.

## 2022-05-11 ENCOUNTER — Encounter (HOSPITAL_COMMUNITY): Payer: Self-pay

## 2022-05-11 ENCOUNTER — Other Ambulatory Visit: Payer: Self-pay

## 2022-05-11 ENCOUNTER — Encounter (HOSPITAL_COMMUNITY)
Admission: RE | Admit: 2022-05-11 | Discharge: 2022-05-11 | Disposition: A | Discharge status: Home / Self Care | Payer: Medicare Other | Source: Ambulatory Visit | Attending: Surgery | Admitting: Surgery

## 2022-05-11 VITALS — BP 135/96 | HR 87 | Temp 98.0°F | Resp 18 | Ht 77.0 in | Wt 219.2 lb

## 2022-05-11 DIAGNOSIS — I10 Essential (primary) hypertension: Secondary | ICD-10-CM | POA: Diagnosis not present

## 2022-05-11 DIAGNOSIS — Z01818 Encounter for other preprocedural examination: Secondary | ICD-10-CM | POA: Diagnosis not present

## 2022-05-11 LAB — CBC
HCT: 48 % (ref 39.0–52.0)
Hemoglobin: 16.6 g/dL (ref 13.0–17.0)
MCH: 30 pg (ref 26.0–34.0)
MCHC: 34.6 g/dL (ref 30.0–36.0)
MCV: 86.8 fL (ref 80.0–100.0)
Platelets: 213 10*3/uL (ref 150–400)
RBC: 5.53 MIL/uL (ref 4.22–5.81)
RDW: 12.5 % (ref 11.5–15.5)
WBC: 8 10*3/uL (ref 4.0–10.5)
nRBC: 0 % (ref 0.0–0.2)

## 2022-05-11 LAB — BASIC METABOLIC PANEL
Anion gap: 6 (ref 5–15)
BUN: 22 mg/dL (ref 8–23)
CO2: 26 mmol/L (ref 22–32)
Calcium: 8.7 mg/dL — ABNORMAL LOW (ref 8.9–10.3)
Chloride: 107 mmol/L (ref 98–111)
Creatinine, Ser: 0.98 mg/dL (ref 0.61–1.24)
GFR, Estimated: >60 mL/min (ref >60)
Glucose, Bld: 150 mg/dL — ABNORMAL HIGH (ref 70–99)
Potassium: 4.1 mmol/L (ref 3.5–5.1)
Sodium: 139 mmol/L (ref 135–145)

## 2022-05-11 NOTE — Progress Notes (Signed)
PCP - Architectural technologist - denies  PPM/ICD - denies   Chest x-ray - n/a EKG - 05/11/22 Stress Test - denies ECHO - 2009 Cardiac Cath - denies  Sleep Study - denies   Follow your surgeon's instructions on when to stop Aspirin.  If no instructions were given by your surgeon then you will need to call the office to get those instructions.     ERAS Protcol -yes PRE-SURGERY Ensure or G2- ensure give  COVID TEST- not needed   Anesthesia review: no  Patient denies shortness of breath, fever, cough and chest pain at PAT appointment   All instructions explained to the patient, with a verbal understanding of the material. Patient agrees to go over the instructions while at home for a better understanding. Patient also instructed to self quarantine after being tested for COVID-19. The opportunity to ask questions was provided.

## 2022-05-13 ENCOUNTER — Telehealth: Payer: Self-pay

## 2022-05-13 NOTE — Patient Outreach (Signed)
  Care Coordination   05/13/2022 Name: AZAZEL FRANZE MRN: [id number] DOB: March 10, 1956   Care Coordination Outreach Attempts:  An unsuccessful telephone outreach was attempted today to offer the patient information about available care coordination services as a benefit of their health plan.   Follow Up Plan:  Additional outreach attempts will be made to offer the patient care coordination information and services.   Encounter Outcome:  No Answer  Care Coordination Interventions Activated:  No   Care Coordination Interventions:  No, not indicated    [facility] [phone]

## 2022-05-16 ENCOUNTER — Encounter (HOSPITAL_COMMUNITY): Payer: Self-pay | Admitting: Anesthesiology

## 2022-05-16 NOTE — H&P (Signed)
PROVIDER: Beverlee Nims, MD  MRN: [id number] DOB: Feb 04, 1956  Subjective   Chief Complaint: New Consultation (Right Inguinal Hernia )   History of Present Illness: Brent Potter is a 66 y.o. male who is seen tfor for evaluation of a right inguinal hernia.  I saw him several years ago for the hernia and he decided to hold on surgery as he was minimally symptomatic. In the interim he developed a rectal cancer and had a robotic proctectomy by Dr. Johney Maine in January 2022. He has been having signs and symptoms of early onset Alzheimer's and is brought by Brent Potter to discuss surgery. He reports that the bulging in the inguinal area has gotten larger but he still has no pain or obstructive symptoms. He reports that it easily reduces.    Review of Systems: A complete review of systems was obtained from the patient. I have reviewed this information and discussed as appropriate with the patient. See HPI as well for other ROS.  ROS   Medical History: Past Medical History:  Diagnosis Date  Anxiety  Arthritis  Hyperlipidemia  Hypertension   Patient Active Problem List  Diagnosis  Adenocarcinoma of rectum (CMS-HCC)  Anxiety disorder  Diverticular disease of colon  Early onset Alzheimer's dementia without behavioral disturbance (CMS-HCC)  Episodic memory loss  Erectile dysfunction  Essential hypertension  Herpes simplex without complication  Hypogonadism in male  Mixed hyperlipidemia  Palpitations  Prolapsed internal hemorrhoids, grade 3  Thrombophlebitis of superficial veins of lower extremity  Vitamin D deficiency  Rhinitis medicamentosa   History reviewed. No pertinent surgical history.   Allergies  Allergen Reactions  Hydrochlorothiazide Other (See Comments)  dehydrated  Losartan Other (See Comments)  severe fatigue/sex dysfunction   Current Outpatient Medications on File Prior to Visit  Medication Sig Dispense Refill  ascorbic acid, vitamin C, (VITAMIN C)  1000 MG tablet Take by mouth  cholecalciferol, vitamin D3, (VITAMIN D3) 125 mcg (5,000 unit) tablet Take by mouth  coenzyme Q10-vitamin E 100-5 mg-unit Cap Take by mouth  diazePAM (VALIUM) 5 MG tablet Take by mouth  DOCOSAHEXAENOIC ACID ORAL Take by mouth  donepeziL (ARICEPT) 10 MG tablet Take 1 tablet by mouth at bedtime  fluticasone propionate (FLONASE) 50 mcg/actuation nasal spray SHAKE LIQUID AND USE 2 SPRAYS IN EACH NOSTRIL DAILY  memantine (NAMENDA) 10 MG tablet Take by mouth  multivitamin with minerals tablet Take by mouth  oxymetazoline (AFRIN) 0.05 % nasal spray Place into one nostril  red yeast rice 600 mg Tab Take by mouth  taurine 500 mg Cap Take by mouth  testosterone (ANDROGEL) 1 % (50 mg/5 gram) gel packet Place onto the skin  TURMERIC ORAL Take by mouth  VITAMIN B COMPLEX ORAL Take 1 tablet by mouth once daily  zinc gluconate 50 mg tablet Take 50 mg by mouth once daily   No current facility-administered medications on file prior to visit.   History reviewed. No pertinent family history.   Social History   Tobacco Use  Smoking Status Never  Smokeless Tobacco Never    Social History   Socioeconomic History  Marital status: Life Partner  Tobacco Use  Smoking status: Never  Smokeless tobacco: Never  Substance and Sexual Activity  Alcohol use: Not Currently  Drug use: Never   Objective:   Vitals:   BP: (!) 140/82  Pulse: 87  Temp: 36.6 C (97.9 F)  SpO2: 98%  Weight: 97.4 kg (214 lb 12.8 oz)  Height: 188 cm ('6\' 2"'$ )   Body  mass index is 27.58 kg/m.  Physical Exam   He appears well on exam and is quite conversant  Brent abdomen is soft and nontender. He has an easily reducible, moderate sized right inguinal hernia which is nontender  Labs, Imaging and Diagnostic Testing:  Assessment and Plan:   Diagnoses and all orders for this visit:  Right inguinal hernia    I again discussed abdominal anatomy with the patient and Brent Potter. I would  recommend because of the increased size of the hernia and Brent early onset dementia that we proceed with an open repair with mesh with a tap block to allow the potential for decreased anesthesia and postoperative issues from mental standpoint I explained the surgical procedure in detail. I discussed the risks which includes but is not limited to bleeding, infection, injury to surrounding structures, use of mesh, cardiopulmonary issues, postoperative recovery, etc. After long discussion, surgery will be scheduled

## 2022-05-17 ENCOUNTER — Ambulatory Visit (HOSPITAL_COMMUNITY): Admission: RE | Admit: 2022-05-17 | Payer: Medicare Other | Source: Home / Self Care | Admitting: Surgery

## 2022-05-17 ENCOUNTER — Encounter (HOSPITAL_COMMUNITY): Admission: RE | Payer: Self-pay | Source: Home / Self Care

## 2022-05-17 SURGERY — REPAIR, HERNIA, INGUINAL, ADULT
Anesthesia: General | Laterality: Right

## 2022-05-24 DIAGNOSIS — Z79899 Other long term (current) drug therapy: Secondary | ICD-10-CM | POA: Diagnosis not present

## 2022-05-24 DIAGNOSIS — F0280 Dementia in other diseases classified elsewhere without behavioral disturbance: Secondary | ICD-10-CM | POA: Diagnosis not present

## 2022-05-24 DIAGNOSIS — G300 Alzheimer's disease with early onset: Secondary | ICD-10-CM | POA: Diagnosis not present

## 2022-06-07 NOTE — Progress Notes (Signed)
Pt had a PAT appt on 05/11/22. Pt's surgery was rescheduled to Thursday, 06/09/22. I spoke with pt's wife, Brent Potter for call to update her on time of arrival and date of surgery. She states she still has the instructions that were given to him at the PAT appt on 05/11/22. She states they have the Pre-Surgery Ensure. We went over as to when he needs to drink it.  She voiced understanding.

## 2022-06-08 NOTE — Anesthesia Preprocedure Evaluation (Signed)
Anesthesia Evaluation  Patient identified by MRN, date of birth, ID band Patient awake    Reviewed: Allergy & Precautions, NPO status , Patient's Chart, lab work & pertinent test results  History of Anesthesia Complications Negative for: history of anesthetic complications  Airway Mallampati: II  TM Distance: >3 FB Neck ROM: Full    Dental  (+) Dental Advisory Given, Teeth Intact   Pulmonary neg pulmonary ROS   Pulmonary exam normal        Cardiovascular hypertension, Normal cardiovascular exam     Neuro/Psych   Anxiety    Dementia negative neurological ROS     GI/Hepatic Neg liver ROS,,,H/o rectal cancer s/p partial proctectomy   Endo/Other  negative endocrine ROS    Renal/GU negative Renal ROS  negative genitourinary   Musculoskeletal negative musculoskeletal ROS (+)    Abdominal   Peds  Hematology negative hematology ROS (+)   Anesthesia Other Findings   Reproductive/Obstetrics                              Anesthesia Physical Anesthesia Plan  ASA: 2  Anesthesia Plan: General   Post-op Pain Management: Regional block*, Tylenol PO (pre-op)* and Toradol IV (intra-op)*   Induction: Intravenous  PONV Risk Score and Plan: 2 and Ondansetron, Dexamethasone and Treatment may vary due to age or medical condition  Airway Management Planned: LMA  Additional Equipment: None  Intra-op Plan:   Post-operative Plan: Extubation in OR  Informed Consent: I have reviewed the patients History and Physical, chart, labs and discussed the procedure including the risks, benefits and alternatives for the proposed anesthesia with the patient or authorized representative who has indicated his/her understanding and acceptance.     Dental advisory given  Plan Discussed with:   Anesthesia Plan Comments:         Anesthesia Quick Evaluation

## 2022-06-08 NOTE — H&P (Signed)
PROVIDER: Beverlee Nims, MD  MRN: [id number] DOB: 02/07/1956  Subjective  Chief Complaint: New Consultation (Right Inguinal Hernia )   History of Present Illness: Brent Potter is a 66 y.o. male who is seen for for evaluation of a right inguinal hernia.  I saw him several years ago for the hernia and he decided to hold on surgery as he was minimally symptomatic. In the interim he developed a rectal cancer and had a robotic ectomy by Dr. Johney Maine in January 2022. He has been having signs and symptoms of early onset Alzheimer's and is brought by his wife to discuss surgery. He reports that the bulging in the inguinal area has gotten larger but he still has no pain or obstructive symptoms. He reports that it easily reduces.    Review of Systems: A complete review of systems was obtained from the patient. I have reviewed this information and discussed as appropriate with the patient. See HPI as well for other ROS.  ROS  Medical History: Past Medical History: Diagnosis Date Anxiety Arthritis Hyperlipidemia Hypertension  Patient Active Problem List Diagnosis Adenocarcinoma of rectum (CMS-HCC) Anxiety disorder Diverticular disease of colon Early onset Alzheimer's dementia without behavioral disturbance (CMS-HCC) Episodic memory loss Erectile dysfunction Essential hypertension Herpes simplex without complication Hypogonadism in male Mixed hyperlipidemia Palpitations Prolapsed internal hemorrhoids, grade 3 Thrombophlebitis of superficial veins of lower extremity Vitamin D deficiency Rhinitis medicamentosa  History reviewed. No pertinent surgical history.  Allergies Allergen Reactions Hydrochlorothiazide Other (See Comments) dehydrated Losartan Other (See Comments) severe fatigue/sex dysfunction  Current Outpatient Medications on File Prior to Visit Medication Sig Dispense Refill ascorbic acid, vitamin C, (VITAMIN C) 1000 MG tablet Take by  mouth cholecalciferol, vitamin D3, (VITAMIN D3) 125 mcg (5,000 unit) tablet Take by mouth coenzyme Q10-vitamin E 100-5 mg-unit Cap Take by mouth diazePAM (VALIUM) 5 MG tablet Take by mouth DOCOSAHEXAENOIC ACID ORAL Take by mouth donepeziL (ARICEPT) 10 MG tablet Take 1 tablet by mouth at bedtime fluticasone propionate (FLONASE) 50 mcg/actuation nasal spray SHAKE LIQUID AND USE 2 SPRAYS IN EACH NOSTRIL DAILY memantine (NAMENDA) 10 MG tablet Take by mouth multivitamin with minerals tablet Take by mouth oxymetazoline (AFRIN) 0.05 % nasal spray Place into one nostril red yeast rice 600 mg Tab Take by mouth taurine 500 mg Cap Take by mouth testosterone (ANDROGEL) 1 % (50 mg/5 gram) gel packet Place onto the skin TURMERIC ORAL Take by mouth VITAMIN B COMPLEX ORAL Take 1 tablet by mouth once daily zinc gluconate 50 mg tablet Take 50 mg by mouth once daily  No current facility-administered medications on file prior to visit.  History reviewed. No pertinent family history.  Social History  Tobacco Use Smoking Status Never Smokeless Tobacco Never   Social History  Socioeconomic History Marital status: Life Partner Tobacco Use Smoking status: Never Smokeless tobacco: Never Substance and Sexual Activity Alcohol use: Not Currently Drug use: Never  Objective:  Vitals:  BP: (!) 140/82 Pulse: 87 Temp: 36.6 C (97.9 F) SpO2: 98% Weight: 97.4 kg (214 lb 12.8 oz) Height: 188 cm ('6\' 2"'$ )  Body mass index is 27.58 kg/m.  Physical Exam  He appears well on exam and is quite conversant  His abdomen is soft and nontender. He has an easily reducible, moderate sized right inguinal hernia which is nontender  Labs, Imaging and Diagnostic Testing:  Assessment and Plan:  Diagnoses and all orders for this visit:  Right inguinal hernia    I again discussed abdominal anatomy with the patient and  his wife. I would recommend because of the increased size of the hernia and his early  onset dementia that we proceed with an open repair with mesh with a tap block to allow the potential for decreased anesthesia and postoperative issues from mental standpoint I explained the surgical procedure in detail. I discussed the risks which includes but is not limited to bleeding, infection, injury to surrounding structures, use of mesh, cardiopulmonary issues, postoperative recovery, etc. After long discussion, surgery will be scheduled

## 2022-06-09 ENCOUNTER — Ambulatory Visit (HOSPITAL_BASED_OUTPATIENT_CLINIC_OR_DEPARTMENT_OTHER): Payer: Medicare Other | Admitting: Anesthesiology

## 2022-06-09 ENCOUNTER — Encounter (HOSPITAL_COMMUNITY)
Admission: RE | Disposition: A | Discharge status: Home / Self Care | Payer: Self-pay | Source: Home / Self Care | Attending: Surgery

## 2022-06-09 ENCOUNTER — Encounter (HOSPITAL_COMMUNITY): Payer: Self-pay | Admitting: Surgery

## 2022-06-09 ENCOUNTER — Other Ambulatory Visit: Payer: Self-pay

## 2022-06-09 ENCOUNTER — Ambulatory Visit (HOSPITAL_COMMUNITY)
Admission: RE | Admit: 2022-06-09 | Discharge: 2022-06-09 | Disposition: A | Discharge status: Home / Self Care | Payer: Medicare Other | Attending: Surgery | Admitting: Surgery

## 2022-06-09 ENCOUNTER — Ambulatory Visit (HOSPITAL_COMMUNITY): Payer: Medicare Other | Admitting: Anesthesiology

## 2022-06-09 DIAGNOSIS — G8918 Other acute postprocedural pain: Secondary | ICD-10-CM | POA: Diagnosis not present

## 2022-06-09 DIAGNOSIS — K4090 Unilateral inguinal hernia, without obstruction or gangrene, not specified as recurrent: Secondary | ICD-10-CM | POA: Diagnosis not present

## 2022-06-09 DIAGNOSIS — F0284 Dementia in other diseases classified elsewhere, unspecified severity, with anxiety: Secondary | ICD-10-CM | POA: Insufficient documentation

## 2022-06-09 DIAGNOSIS — Z85048 Personal history of other malignant neoplasm of rectum, rectosigmoid junction, and anus: Secondary | ICD-10-CM | POA: Insufficient documentation

## 2022-06-09 HISTORY — PX: INGUINAL HERNIA REPAIR: SHX194

## 2022-06-09 HISTORY — PX: INSERTION OF MESH: SHX5868

## 2022-06-09 SURGERY — REPAIR, HERNIA, INGUINAL, ADULT
Anesthesia: General | Site: Inguinal | Laterality: Right

## 2022-06-09 MED ORDER — FENTANYL CITRATE (PF) 250 MCG/5ML IJ SOLN
INTRAMUSCULAR | Status: AC
  Filled 2022-06-09: qty 5

## 2022-06-09 MED ORDER — AMISULPRIDE (ANTIEMETIC) 5 MG/2ML IV SOLN: 10.0000 mg | Freq: Once | INTRAVENOUS | Status: DC | PRN

## 2022-06-09 MED ORDER — OXYCODONE HCL 5 MG/5ML PO SOLN
ORAL | Status: AC
  Filled 2022-06-09: qty 5

## 2022-06-09 MED ORDER — TRAMADOL HCL 50 MG PO TABS: 50.0000 mg | ORAL_TABLET | Freq: Four times a day (QID) | ORAL | 0 refills | Status: DC | PRN

## 2022-06-09 MED ORDER — FENTANYL CITRATE (PF) 100 MCG/2ML IJ SOLN: 25.0000 ug | INTRAMUSCULAR | Status: DC | PRN

## 2022-06-09 MED ORDER — ORAL CARE MOUTH RINSE: 15.0000 mL | Freq: Once | OROMUCOSAL | Status: AC

## 2022-06-09 MED ORDER — FENTANYL CITRATE (PF) 250 MCG/5ML IJ SOLN
INTRAMUSCULAR | Status: DC | PRN
  Administered 2022-06-09 (×2): 100 ug via INTRAVENOUS

## 2022-06-09 MED ORDER — BUPIVACAINE-EPINEPHRINE 0.5% -1:200000 IJ SOLN
INTRAMUSCULAR | Status: DC | PRN
  Administered 2022-06-09: 10 mL

## 2022-06-09 MED ORDER — 0.9 % SODIUM CHLORIDE (POUR BTL) OPTIME
TOPICAL | Status: DC | PRN
  Administered 2022-06-09: 1000 mL

## 2022-06-09 MED ORDER — PROPOFOL 500 MG/50ML IV EMUL
INTRAVENOUS | Status: DC | PRN
  Administered 2022-06-09: 125 ug/kg/min via INTRAVENOUS

## 2022-06-09 MED ORDER — PROPOFOL 10 MG/ML IV BOLUS
INTRAVENOUS | Status: AC
  Filled 2022-06-09: qty 20

## 2022-06-09 MED ORDER — CHLORHEXIDINE GLUCONATE CLOTH 2 % EX PADS: 6.0000 | MEDICATED_PAD | Freq: Once | CUTANEOUS | Status: DC

## 2022-06-09 MED ORDER — EPHEDRINE SULFATE-NACL 50-0.9 MG/10ML-% IV SOSY
PREFILLED_SYRINGE | INTRAVENOUS | Status: DC | PRN
  Administered 2022-06-09 (×2): 5 mg via INTRAVENOUS

## 2022-06-09 MED ORDER — CHLORHEXIDINE GLUCONATE 0.12 % MT SOLN
15.0000 mL | Freq: Once | OROMUCOSAL | Status: AC
  Administered 2022-06-09: 15 mL via OROMUCOSAL
  Filled 2022-06-09: qty 15

## 2022-06-09 MED ORDER — CEFAZOLIN SODIUM-DEXTROSE 2-4 GM/100ML-% IV SOLN
2.0000 g | INTRAVENOUS | Status: AC
  Administered 2022-06-09: 2 g via INTRAVENOUS
  Filled 2022-06-09: qty 100

## 2022-06-09 MED ORDER — BUPIVACAINE-EPINEPHRINE (PF) 0.25% -1:200000 IJ SOLN
INTRAMUSCULAR | Status: AC
  Filled 2022-06-09: qty 30

## 2022-06-09 MED ORDER — OXYCODONE HCL 5 MG PO TABS: 5.0000 mg | ORAL_TABLET | Freq: Once | ORAL | Status: AC | PRN

## 2022-06-09 MED ORDER — ROPIVACAINE HCL 5 MG/ML IJ SOLN
INTRAMUSCULAR | Status: DC | PRN
  Administered 2022-06-09: 30 mL via PERINEURAL

## 2022-06-09 MED ORDER — BUPIVACAINE-EPINEPHRINE (PF) 0.5% -1:200000 IJ SOLN
INTRAMUSCULAR | Status: AC
  Filled 2022-06-09: qty 30

## 2022-06-09 MED ORDER — MIDAZOLAM HCL 2 MG/2ML IJ SOLN
INTRAMUSCULAR | Status: AC
  Filled 2022-06-09: qty 2

## 2022-06-09 MED ORDER — DEXAMETHASONE SODIUM PHOSPHATE 10 MG/ML IJ SOLN
INTRAMUSCULAR | Status: DC | PRN
  Administered 2022-06-09: 5 mg via INTRAVENOUS

## 2022-06-09 MED ORDER — OXYCODONE HCL 5 MG/5ML PO SOLN
5.0000 mg | Freq: Once | ORAL | Status: AC | PRN
  Administered 2022-06-09: 5 mg via ORAL

## 2022-06-09 MED ORDER — PROPOFOL 10 MG/ML IV BOLUS
INTRAVENOUS | Status: DC | PRN
  Administered 2022-06-09: 20 mg via INTRAVENOUS
  Administered 2022-06-09: 150 mg via INTRAVENOUS
  Administered 2022-06-09 (×2): 30 mg via INTRAVENOUS

## 2022-06-09 MED ORDER — ACETAMINOPHEN 500 MG PO TABS
1000.0000 mg | ORAL_TABLET | ORAL | Status: AC
  Administered 2022-06-09: 1000 mg via ORAL
  Filled 2022-06-09: qty 2

## 2022-06-09 MED ORDER — ONDANSETRON HCL 4 MG/2ML IJ SOLN
INTRAMUSCULAR | Status: DC | PRN
  Administered 2022-06-09: 4 mg via INTRAVENOUS

## 2022-06-09 MED ORDER — ENSURE PRE-SURGERY PO LIQD: 296.0000 mL | Freq: Once | ORAL | Status: DC

## 2022-06-09 MED ORDER — LIDOCAINE 2% (20 MG/ML) 5 ML SYRINGE
INTRAMUSCULAR | Status: DC | PRN
  Administered 2022-06-09: 40 mg via INTRAVENOUS

## 2022-06-09 MED ORDER — LACTATED RINGERS IV SOLN: INTRAVENOUS | Status: DC

## 2022-06-09 MED ORDER — ONDANSETRON HCL 4 MG/2ML IJ SOLN: 4.0000 mg | Freq: Once | INTRAMUSCULAR | Status: DC | PRN

## 2022-06-09 SURGICAL SUPPLY — 35 items
ADH SKN CLS APL DERMABOND .7 (GAUZE/BANDAGES/DRESSINGS) ×1
APL PRP STRL LF DISP 70% ISPRP (MISCELLANEOUS) ×1
BAG COUNTER SPONGE SURGICOUNT (BAG) IMPLANT
BAG SPNG CNTER NS LX DISP (BAG) ×1
BLADE CLIPPER SURG (BLADE) IMPLANT
CHLORAPREP W/TINT 26 (MISCELLANEOUS) ×1 IMPLANT
COVER SURGICAL LIGHT HANDLE (MISCELLANEOUS) ×1 IMPLANT
DERMABOND ADVANCED .7 DNX12 (GAUZE/BANDAGES/DRESSINGS) ×1 IMPLANT
DRAIN PENROSE 1/2X12 LTX STRL (WOUND CARE) IMPLANT
DRAPE LAPAROTOMY TRNSV 102X78 (DRAPES) ×1 IMPLANT
ELECT REM PT RETURN 9FT ADLT (ELECTROSURGICAL) ×1
ELECTRODE REM PT RTRN 9FT ADLT (ELECTROSURGICAL) ×1 IMPLANT
GLOVE SURG SIGNA 7.5 PF LTX (GLOVE) ×1 IMPLANT
GOWN STRL REUS W/ TWL LRG LVL3 (GOWN DISPOSABLE) ×1 IMPLANT
GOWN STRL REUS W/ TWL XL LVL3 (GOWN DISPOSABLE) ×1 IMPLANT
GOWN STRL REUS W/TWL LRG LVL3 (GOWN DISPOSABLE) ×1
GOWN STRL REUS W/TWL XL LVL3 (GOWN DISPOSABLE) ×1
KIT BASIN OR (CUSTOM PROCEDURE TRAY) ×1 IMPLANT
KIT TURNOVER KIT B (KITS) ×1 IMPLANT
MESH PARIETEX PROGRIP RIGHT (Mesh General) IMPLANT
NDL HYPO 25GX1X1/2 BEV (NEEDLE) ×1 IMPLANT
NEEDLE HYPO 25GX1X1/2 BEV (NEEDLE) ×1 IMPLANT
NS IRRIG 1000ML POUR BTL (IV SOLUTION) ×1 IMPLANT
PACK GENERAL/GYN (CUSTOM PROCEDURE TRAY) ×1 IMPLANT
PAD ARMBOARD 7.5X6 YLW CONV (MISCELLANEOUS) ×1 IMPLANT
PENCIL SMOKE EVACUATOR (MISCELLANEOUS) ×1 IMPLANT
SUT MON AB 4-0 PC3 18 (SUTURE) ×1 IMPLANT
SUT SILK 2 0 SH (SUTURE) IMPLANT
SUT VIC AB 2-0 CT1 27 (SUTURE) ×1
SUT VIC AB 2-0 CT1 36 (SUTURE) IMPLANT
SUT VIC AB 2-0 CT1 TAPERPNT 27 (SUTURE) IMPLANT
SUT VIC AB 3-0 CT1 27 (SUTURE) ×1
SUT VIC AB 3-0 CT1 TAPERPNT 27 (SUTURE) IMPLANT
SYR CONTROL 10ML LL (SYRINGE) ×1 IMPLANT
TOWEL GREEN STERILE (TOWEL DISPOSABLE) ×1 IMPLANT

## 2022-06-09 NOTE — Discharge Instructions (Signed)
[facility] _____[facility]  UMBILICAL OR INGUINAL HERNIA REPAIR: POST OP INSTRUCTIONS  Always review your discharge instruction sheet given to you by the facility where your surgery was performed. IF YOU HAVE DISABILITY OR FAMILY LEAVE FORMS, YOU MUST BRING THEM TO THE OFFICE FOR PROCESSING.   DO NOT GIVE THEM TO YOUR DOCTOR.  1. A  prescription for pain medication may be given to you upon discharge.  Take your pain medication as prescribed, if needed.  If narcotic pain medicine is not needed, then you may take acetaminophen (Tylenol) or ibuprofen (Advil) as needed. 2. Take your usually prescribed medications unless otherwise directed. If you need a refill on your pain medication, please contact your pharmacy.  They will contact our office to request authorization. Prescriptions will not be filled after 5 pm or on week-ends. 3. You should follow a light diet the first 24 hours after arrival home, such as soup and crackers, etc.  Be sure to include lots of fluids daily.  Resume your normal diet the day after surgery. 4.Most patients will experience some swelling and bruising around the umbilicus or in the groin and scrotum.  Ice packs and reclining will help.  Swelling and bruising can take several days to resolve.  6. It is common to experience some constipation if taking pain medication after surgery.  Increasing fluid intake and taking a stool softener (such as Colace) will usually help or prevent this problem from occurring.  A mild laxative (Milk of Magnesia or Miralax) should be taken according to package directions if there are no bowel movements after 48 hours. 7. Unless discharge instructions indicate otherwise, you may remove your bandages 24-48 hours after surgery, and you may shower at that time.  You may have steri-strips (small skin tapes) in place directly over the incision.  These strips should be left on the skin for 7-10 days.  If your surgeon used skin glue on the  incision, you may shower in 24 hours.  The glue will flake off over the next 2-3 weeks.  Any sutures or staples will be removed at the office during your follow-up visit. 8. ACTIVITIES:  You may resume regular (light) daily activities beginning the next day--such as daily self-care, walking, climbing stairs--gradually increasing activities as tolerated.  You may have sexual intercourse when it is comfortable.  Refrain from any heavy lifting or straining until approved by your doctor.  a.You may drive when you are no longer taking prescription pain medication, you can comfortably wear a seatbelt, and you can safely maneuver your car and apply brakes. b.RETURN TO WORK:   _____________________________________________  9.You should see your doctor in the office for a follow-up appointment approximately 2-3 weeks after your surgery.  Make sure that you call for this appointment within a day or two after you arrive home to insure a convenient appointment time. 10.OTHER INSTRUCTIONS: _NO LIFTING MORE THAN 15 POUNDS FOR 4 WEEKS ICE PACK, TYLENOL, IBUPROFEN ALSO FOR PAIN YOU MAY SHOWER STARTING TOMORROW________________________    _____________________________________  WHEN TO CALL YOUR DOCTOR: Fever over 101.0 Inability to urinate Nausea and/or vomiting Extreme swelling or bruising Continued bleeding from incision. Increased pain, redness, or drainage from the incision  The clinic staff is available to answer your questions during regular business hours.  Please don't hesitate to call and ask to speak to one of the nurses for clinical concerns.  If you have a medical emergency, go to the nearest emergency room or call 911.  A  surgeon from [facility] is always on call at the hospital   [address], [place], [place], [place]  [address] ?  [address], [place], [place]   [address] [phone] ? [phone] ? FAX [phone] Web site: [email or web address]

## 2022-06-09 NOTE — Anesthesia Procedure Notes (Signed)
Anesthesia Regional Block: TAP block   Pre-Anesthetic Checklist: , timeout performed,  Correct Patient, Correct Site, Correct Laterality,  Correct Procedure, Correct Position, site marked,  Risks and benefits discussed,  Surgical consent,  Pre-op evaluation,  At surgeon's request and post-op pain management  Laterality: Right  Prep: chloraprep       Needles:  Injection technique: Single-shot  Needle Type: Echogenic Stimulator Needle     Needle Length: 10cm  Needle Gauge: 20     Additional Needles:   Procedures:,,,, ultrasound used (permanent image in chart),,    Narrative:  Start time: 06/09/2022 6:58 AM End time: 06/09/2022 7:03 AM Injection made incrementally with aspirations every 5 mL.  Performed by: Personally  Anesthesiologist: Lidia Collum, MD  Additional Notes: Standard monitors applied. Skin prepped. Good needle visualization with ultrasound. Injection made in 5cc increments with no resistance to injection. Patient tolerated the procedure well.

## 2022-06-09 NOTE — Anesthesia Postprocedure Evaluation (Signed)
Anesthesia Post Note  Patient: Brent Potter  Procedure(s) Performed: OPEN RIGHT INGUINAL HERNIA REPAIR (Right: Inguinal) INSERTION OF MESH (Right: Inguinal)     Patient location during evaluation: PACU Anesthesia Type: General Level of consciousness: awake and alert Pain management: pain level controlled Vital Signs Assessment: post-procedure vital signs reviewed and stable Respiratory status: spontaneous breathing, nonlabored ventilation and respiratory function stable Cardiovascular status: blood pressure returned to baseline and stable Postop Assessment: no apparent nausea or vomiting Anesthetic complications: no   No notable events documented.  Last Vitals:  Vitals:   06/09/22 0900 06/09/22 0915  BP: 111/78 111/88  Pulse: (!) 59 (!) 55  Resp: (!) 22 20  Temp:    SpO2: 98% 99%    Last Pain:  Vitals:   06/09/22 0915  TempSrc:   PainSc: 3                  Lidia Collum

## 2022-06-09 NOTE — Interval H&P Note (Signed)
History and Physical Interval Note: no change in H and P  06/09/2022 7:05 AM  Brent Potter  has presented today for surgery, with the diagnosis of RIGHT INGUINAL HERNIA.  The various methods of treatment have been discussed with the patient and family. After consideration of risks, benefits and other options for treatment, the patient has consented to  Procedure(s): OPEN RIGHT INGUINAL HERNIA REPAIR WITH MESH (Right) as a surgical intervention.  The patient's history has been reviewed, patient examined, no change in status, stable for surgery.  I have reviewed the patient's chart and labs.  Questions were answered to the patient's satisfaction.     Coralie Keens

## 2022-06-09 NOTE — Anesthesia Procedure Notes (Signed)
Procedure Name: LMA Insertion Date/Time: 06/09/2022 7:45 AM  Performed by: Darletta Moll, CRNAPre-anesthesia Checklist: Patient identified, Emergency Drugs available, Suction available and Patient being monitored Patient Re-evaluated:Patient Re-evaluated prior to induction Oxygen Delivery Method: Circle system utilized Preoxygenation: Pre-oxygenation with 100% oxygen Induction Type: IV induction Ventilation: Mask ventilation without difficulty LMA: LMA inserted LMA Size: 5.0 Number of attempts: 1 Placement Confirmation: ETT inserted through vocal cords under direct vision, positive ETCO2 and breath sounds checked- equal and bilateral Tube secured with: Tape Dental Injury: Teeth and Oropharynx as per pre-operative assessment

## 2022-06-09 NOTE — Transfer of Care (Signed)
Immediate Anesthesia Transfer of Care Note  Patient: ARNEY MAYABB  Procedure(s) Performed: OPEN RIGHT INGUINAL HERNIA REPAIR (Right: Inguinal) INSERTION OF MESH (Right: Inguinal)  Patient Location: PACU  Anesthesia Type:General and Regional  Level of Consciousness: drowsy and patient cooperative  Airway & Oxygen Therapy: Patient Spontanous Breathing  Post-op Assessment: Report given to RN, Post -op Vital signs reviewed and stable, and Patient moving all extremities X 4  Post vital signs: Reviewed and stable  Last Vitals:  Vitals Value Taken Time  BP 101/71 06/09/22 0830  Temp 36.6 C 06/09/22 0830  Pulse 58 06/09/22 0834  Resp 13 06/09/22 0834  SpO2 95 % 06/09/22 0834  Vitals shown include unvalidated device data.  Last Pain:  Vitals:   06/09/22 0830  TempSrc:   PainSc: 0-No pain         Complications: No notable events documented.

## 2022-06-09 NOTE — Op Note (Signed)
OPEN RIGHT INGUINAL HERNIA REPAIR, INSERTION OF MESH  Procedure Note  Brent Potter 06/09/2022   Pre-op Diagnosis: RIGHT INGUINAL HERNIA     Post-op Diagnosis: same  Procedure(s): OPEN RIGHT INGUINAL HERNIA REPAIR INSERTION OF MESH  Surgeon(s): Coralie Keens, MD  Anesthesia: General  Staff:  Circulator: Margy Clarks, RN Relief Scrub: Lyndle Herrlich, CST Scrub Person: Verlene Mayer  Estimated Blood Loss: Minimal               Findings: The patient was found to have a very large indirect right inguinal hernia.  He also had a very weak inguinal floor.  The hernia was repaired with a large piece of Prolene ProGrip mesh from [employer]  Procedure: The patient was identified in the preoperative holding area and anesthesiology performed a right tap block under ultrasound guidance.  The patient was then taken to the operating room.  He was placed upon the operating table general anesthesia was induced.  His right lower abdomen was then prepped and draped in usual sterile fashion.  I anesthetized the skin of the right inguinal area with Marcaine and then made a longitudinal incision with a scalpel.  I dissected down through Scarpa's fascia with electrocautery.  I then identified the external oblique fascia and opened it toward the internal and external rings.  The patient had a very large hernia sac.  He had a very weak inguinal floor.  I was able to separate the large sac from the cord structures and controlled the testicular cord with a Penrose drain.  All contents were reduced back into the abdominal cavity.  I opened the sac partly to confirm this.  I then tied off the base of the sac with a 2-0 silk suture.  The inguinal ring was very large so I used the remaining sac to reinforce and imbricate the internal ring as I involuted it back to the abdominal cavity.  Next a large piece of Prolene ProGrip mesh was brought to the field.  I placed an on the inguinal floor against the  pubic tubercle and the brought around the cord structures.  I then had to suture in place in multiple areas with interrupted 2-0 Vicryl sutures.  Once the mesh was in place, hemostasis appeared to be achieved.  I then closed the external oblique fascia over the top of the mesh with a running 2-0 Vicryl suture.  Scarpa's fascia was then closed interrupted 3-0 Vicryl sutures and the skin was closed with running 4-0 Monocryl.  Dermabond was then applied.  The patient tolerated the procedure well.  All the counts were correct at the end of the procedure.  The patient was then extubated in the operating room and taken in a stable condition to the recovery room.          Coralie Keens   Date: 06/09/2022  Time: 8:25 AM

## 2022-06-10 ENCOUNTER — Encounter (HOSPITAL_COMMUNITY): Payer: Self-pay | Admitting: Surgery

## 2022-07-07 DIAGNOSIS — H0100A Unspecified blepharitis right eye, upper and lower eyelids: Secondary | ICD-10-CM | POA: Diagnosis not present

## 2022-07-10 DIAGNOSIS — Z9181 History of falling: Secondary | ICD-10-CM | POA: Diagnosis not present

## 2022-07-10 DIAGNOSIS — I10 Essential (primary) hypertension: Secondary | ICD-10-CM | POA: Diagnosis not present

## 2022-07-10 DIAGNOSIS — J310 Chronic rhinitis: Secondary | ICD-10-CM | POA: Diagnosis not present

## 2022-07-10 DIAGNOSIS — K5730 Diverticulosis of large intestine without perforation or abscess without bleeding: Secondary | ICD-10-CM | POA: Diagnosis not present

## 2022-07-10 DIAGNOSIS — E782 Mixed hyperlipidemia: Secondary | ICD-10-CM | POA: Diagnosis not present

## 2022-07-10 DIAGNOSIS — G300 Alzheimer's disease with early onset: Secondary | ICD-10-CM | POA: Diagnosis not present

## 2022-07-10 DIAGNOSIS — F0284 Dementia in other diseases classified elsewhere, unspecified severity, with anxiety: Secondary | ICD-10-CM | POA: Diagnosis not present

## 2022-07-10 DIAGNOSIS — E559 Vitamin D deficiency, unspecified: Secondary | ICD-10-CM | POA: Diagnosis not present

## 2022-07-10 DIAGNOSIS — T485X1D Poisoning by other anti-common-cold drugs, accidental (unintentional), subsequent encounter: Secondary | ICD-10-CM | POA: Diagnosis not present

## 2022-08-09 DIAGNOSIS — E559 Vitamin D deficiency, unspecified: Secondary | ICD-10-CM | POA: Diagnosis not present

## 2022-08-09 DIAGNOSIS — E782 Mixed hyperlipidemia: Secondary | ICD-10-CM | POA: Diagnosis not present

## 2022-08-09 DIAGNOSIS — Z9181 History of falling: Secondary | ICD-10-CM | POA: Diagnosis not present

## 2022-08-09 DIAGNOSIS — G300 Alzheimer's disease with early onset: Secondary | ICD-10-CM | POA: Diagnosis not present

## 2022-08-09 DIAGNOSIS — I10 Essential (primary) hypertension: Secondary | ICD-10-CM | POA: Diagnosis not present

## 2022-08-09 DIAGNOSIS — J310 Chronic rhinitis: Secondary | ICD-10-CM | POA: Diagnosis not present

## 2022-08-09 DIAGNOSIS — F0284 Dementia in other diseases classified elsewhere, unspecified severity, with anxiety: Secondary | ICD-10-CM | POA: Diagnosis not present

## 2022-08-09 DIAGNOSIS — K5730 Diverticulosis of large intestine without perforation or abscess without bleeding: Secondary | ICD-10-CM | POA: Diagnosis not present

## 2022-08-09 DIAGNOSIS — T485X1D Poisoning by other anti-common-cold drugs, accidental (unintentional), subsequent encounter: Secondary | ICD-10-CM | POA: Diagnosis not present

## 2022-08-10 DIAGNOSIS — T485X1D Poisoning by other anti-common-cold drugs, accidental (unintentional), subsequent encounter: Secondary | ICD-10-CM | POA: Diagnosis not present

## 2022-08-10 DIAGNOSIS — E782 Mixed hyperlipidemia: Secondary | ICD-10-CM | POA: Diagnosis not present

## 2022-08-10 DIAGNOSIS — F0284 Dementia in other diseases classified elsewhere, unspecified severity, with anxiety: Secondary | ICD-10-CM | POA: Diagnosis not present

## 2022-08-10 DIAGNOSIS — G300 Alzheimer's disease with early onset: Secondary | ICD-10-CM | POA: Diagnosis not present

## 2022-08-10 DIAGNOSIS — Z9181 History of falling: Secondary | ICD-10-CM | POA: Diagnosis not present

## 2022-08-10 DIAGNOSIS — J310 Chronic rhinitis: Secondary | ICD-10-CM | POA: Diagnosis not present

## 2022-08-10 DIAGNOSIS — I10 Essential (primary) hypertension: Secondary | ICD-10-CM | POA: Diagnosis not present

## 2022-08-10 DIAGNOSIS — K5730 Diverticulosis of large intestine without perforation or abscess without bleeding: Secondary | ICD-10-CM | POA: Diagnosis not present

## 2022-08-10 DIAGNOSIS — E559 Vitamin D deficiency, unspecified: Secondary | ICD-10-CM | POA: Diagnosis not present

## 2022-08-11 DIAGNOSIS — F0284 Dementia in other diseases classified elsewhere, unspecified severity, with anxiety: Secondary | ICD-10-CM | POA: Diagnosis not present

## 2022-08-11 DIAGNOSIS — E559 Vitamin D deficiency, unspecified: Secondary | ICD-10-CM | POA: Diagnosis not present

## 2022-08-11 DIAGNOSIS — K5730 Diverticulosis of large intestine without perforation or abscess without bleeding: Secondary | ICD-10-CM | POA: Diagnosis not present

## 2022-08-11 DIAGNOSIS — Z9181 History of falling: Secondary | ICD-10-CM | POA: Diagnosis not present

## 2022-08-11 DIAGNOSIS — J310 Chronic rhinitis: Secondary | ICD-10-CM | POA: Diagnosis not present

## 2022-08-11 DIAGNOSIS — G300 Alzheimer's disease with early onset: Secondary | ICD-10-CM | POA: Diagnosis not present

## 2022-08-11 DIAGNOSIS — I10 Essential (primary) hypertension: Secondary | ICD-10-CM | POA: Diagnosis not present

## 2022-08-11 DIAGNOSIS — T485X1D Poisoning by other anti-common-cold drugs, accidental (unintentional), subsequent encounter: Secondary | ICD-10-CM | POA: Diagnosis not present

## 2022-08-11 DIAGNOSIS — E782 Mixed hyperlipidemia: Secondary | ICD-10-CM | POA: Diagnosis not present

## 2022-08-16 DIAGNOSIS — F0284 Dementia in other diseases classified elsewhere, unspecified severity, with anxiety: Secondary | ICD-10-CM | POA: Diagnosis not present

## 2022-08-16 DIAGNOSIS — E559 Vitamin D deficiency, unspecified: Secondary | ICD-10-CM | POA: Diagnosis not present

## 2022-08-16 DIAGNOSIS — Z9181 History of falling: Secondary | ICD-10-CM | POA: Diagnosis not present

## 2022-08-16 DIAGNOSIS — E782 Mixed hyperlipidemia: Secondary | ICD-10-CM | POA: Diagnosis not present

## 2022-08-16 DIAGNOSIS — K5730 Diverticulosis of large intestine without perforation or abscess without bleeding: Secondary | ICD-10-CM | POA: Diagnosis not present

## 2022-08-16 DIAGNOSIS — T485X1D Poisoning by other anti-common-cold drugs, accidental (unintentional), subsequent encounter: Secondary | ICD-10-CM | POA: Diagnosis not present

## 2022-08-16 DIAGNOSIS — G300 Alzheimer's disease with early onset: Secondary | ICD-10-CM | POA: Diagnosis not present

## 2022-08-16 DIAGNOSIS — J310 Chronic rhinitis: Secondary | ICD-10-CM | POA: Diagnosis not present

## 2022-08-16 DIAGNOSIS — I10 Essential (primary) hypertension: Secondary | ICD-10-CM | POA: Diagnosis not present

## 2022-08-18 DIAGNOSIS — T485X1D Poisoning by other anti-common-cold drugs, accidental (unintentional), subsequent encounter: Secondary | ICD-10-CM | POA: Diagnosis not present

## 2022-08-18 DIAGNOSIS — E559 Vitamin D deficiency, unspecified: Secondary | ICD-10-CM | POA: Diagnosis not present

## 2022-08-18 DIAGNOSIS — I10 Essential (primary) hypertension: Secondary | ICD-10-CM | POA: Diagnosis not present

## 2022-08-18 DIAGNOSIS — Z9181 History of falling: Secondary | ICD-10-CM | POA: Diagnosis not present

## 2022-08-18 DIAGNOSIS — G300 Alzheimer's disease with early onset: Secondary | ICD-10-CM | POA: Diagnosis not present

## 2022-08-18 DIAGNOSIS — F0284 Dementia in other diseases classified elsewhere, unspecified severity, with anxiety: Secondary | ICD-10-CM | POA: Diagnosis not present

## 2022-08-18 DIAGNOSIS — K5730 Diverticulosis of large intestine without perforation or abscess without bleeding: Secondary | ICD-10-CM | POA: Diagnosis not present

## 2022-08-18 DIAGNOSIS — E782 Mixed hyperlipidemia: Secondary | ICD-10-CM | POA: Diagnosis not present

## 2022-08-18 DIAGNOSIS — J310 Chronic rhinitis: Secondary | ICD-10-CM | POA: Diagnosis not present

## 2022-08-25 DIAGNOSIS — I10 Essential (primary) hypertension: Secondary | ICD-10-CM | POA: Diagnosis not present

## 2022-08-25 DIAGNOSIS — K5730 Diverticulosis of large intestine without perforation or abscess without bleeding: Secondary | ICD-10-CM | POA: Diagnosis not present

## 2022-08-25 DIAGNOSIS — E559 Vitamin D deficiency, unspecified: Secondary | ICD-10-CM | POA: Diagnosis not present

## 2022-08-25 DIAGNOSIS — Z9181 History of falling: Secondary | ICD-10-CM | POA: Diagnosis not present

## 2022-08-25 DIAGNOSIS — G300 Alzheimer's disease with early onset: Secondary | ICD-10-CM | POA: Diagnosis not present

## 2022-08-25 DIAGNOSIS — E782 Mixed hyperlipidemia: Secondary | ICD-10-CM | POA: Diagnosis not present

## 2022-08-25 DIAGNOSIS — J310 Chronic rhinitis: Secondary | ICD-10-CM | POA: Diagnosis not present

## 2022-08-25 DIAGNOSIS — F0284 Dementia in other diseases classified elsewhere, unspecified severity, with anxiety: Secondary | ICD-10-CM | POA: Diagnosis not present

## 2022-08-25 DIAGNOSIS — T485X1D Poisoning by other anti-common-cold drugs, accidental (unintentional), subsequent encounter: Secondary | ICD-10-CM | POA: Diagnosis not present

## 2022-08-31 DIAGNOSIS — I10 Essential (primary) hypertension: Secondary | ICD-10-CM | POA: Diagnosis not present

## 2022-08-31 DIAGNOSIS — G300 Alzheimer's disease with early onset: Secondary | ICD-10-CM | POA: Diagnosis not present

## 2022-08-31 DIAGNOSIS — E559 Vitamin D deficiency, unspecified: Secondary | ICD-10-CM | POA: Diagnosis not present

## 2022-08-31 DIAGNOSIS — Z9181 History of falling: Secondary | ICD-10-CM | POA: Diagnosis not present

## 2022-08-31 DIAGNOSIS — F0284 Dementia in other diseases classified elsewhere, unspecified severity, with anxiety: Secondary | ICD-10-CM | POA: Diagnosis not present

## 2022-08-31 DIAGNOSIS — T485X1D Poisoning by other anti-common-cold drugs, accidental (unintentional), subsequent encounter: Secondary | ICD-10-CM | POA: Diagnosis not present

## 2022-08-31 DIAGNOSIS — E782 Mixed hyperlipidemia: Secondary | ICD-10-CM | POA: Diagnosis not present

## 2022-08-31 DIAGNOSIS — J310 Chronic rhinitis: Secondary | ICD-10-CM | POA: Diagnosis not present

## 2022-08-31 DIAGNOSIS — K5730 Diverticulosis of large intestine without perforation or abscess without bleeding: Secondary | ICD-10-CM | POA: Diagnosis not present

## 2022-09-02 DIAGNOSIS — E782 Mixed hyperlipidemia: Secondary | ICD-10-CM | POA: Diagnosis not present

## 2022-09-02 DIAGNOSIS — Z9181 History of falling: Secondary | ICD-10-CM | POA: Diagnosis not present

## 2022-09-02 DIAGNOSIS — G300 Alzheimer's disease with early onset: Secondary | ICD-10-CM | POA: Diagnosis not present

## 2022-09-02 DIAGNOSIS — F0284 Dementia in other diseases classified elsewhere, unspecified severity, with anxiety: Secondary | ICD-10-CM | POA: Diagnosis not present

## 2022-09-02 DIAGNOSIS — T485X1D Poisoning by other anti-common-cold drugs, accidental (unintentional), subsequent encounter: Secondary | ICD-10-CM | POA: Diagnosis not present

## 2022-09-02 DIAGNOSIS — J310 Chronic rhinitis: Secondary | ICD-10-CM | POA: Diagnosis not present

## 2022-09-02 DIAGNOSIS — K5730 Diverticulosis of large intestine without perforation or abscess without bleeding: Secondary | ICD-10-CM | POA: Diagnosis not present

## 2022-09-02 DIAGNOSIS — E559 Vitamin D deficiency, unspecified: Secondary | ICD-10-CM | POA: Diagnosis not present

## 2022-09-02 DIAGNOSIS — I10 Essential (primary) hypertension: Secondary | ICD-10-CM | POA: Diagnosis not present

## 2022-09-07 DIAGNOSIS — G300 Alzheimer's disease with early onset: Secondary | ICD-10-CM | POA: Diagnosis not present

## 2022-09-07 DIAGNOSIS — E782 Mixed hyperlipidemia: Secondary | ICD-10-CM | POA: Diagnosis not present

## 2022-09-07 DIAGNOSIS — T485X1D Poisoning by other anti-common-cold drugs, accidental (unintentional), subsequent encounter: Secondary | ICD-10-CM | POA: Diagnosis not present

## 2022-09-07 DIAGNOSIS — Z9181 History of falling: Secondary | ICD-10-CM | POA: Diagnosis not present

## 2022-09-07 DIAGNOSIS — I10 Essential (primary) hypertension: Secondary | ICD-10-CM | POA: Diagnosis not present

## 2022-09-07 DIAGNOSIS — E559 Vitamin D deficiency, unspecified: Secondary | ICD-10-CM | POA: Diagnosis not present

## 2022-09-07 DIAGNOSIS — K5730 Diverticulosis of large intestine without perforation or abscess without bleeding: Secondary | ICD-10-CM | POA: Diagnosis not present

## 2022-09-07 DIAGNOSIS — J310 Chronic rhinitis: Secondary | ICD-10-CM | POA: Diagnosis not present

## 2022-09-07 DIAGNOSIS — F0284 Dementia in other diseases classified elsewhere, unspecified severity, with anxiety: Secondary | ICD-10-CM | POA: Diagnosis not present

## 2022-09-20 DIAGNOSIS — G300 Alzheimer's disease with early onset: Secondary | ICD-10-CM | POA: Diagnosis not present

## 2022-09-20 DIAGNOSIS — F0280 Dementia in other diseases classified elsewhere without behavioral disturbance: Secondary | ICD-10-CM | POA: Diagnosis not present

## 2022-10-27 ENCOUNTER — Other Ambulatory Visit: Payer: Self-pay

## 2022-10-27 ENCOUNTER — Encounter (HOSPITAL_BASED_OUTPATIENT_CLINIC_OR_DEPARTMENT_OTHER): Payer: Self-pay | Admitting: Emergency Medicine

## 2022-10-27 ENCOUNTER — Emergency Department (HOSPITAL_BASED_OUTPATIENT_CLINIC_OR_DEPARTMENT_OTHER)
Admission: EM | Admit: 2022-10-27 | Discharge: 2022-10-27 | Disposition: A | Discharge status: Home / Self Care | Payer: Medicare Other | Attending: Emergency Medicine | Admitting: Emergency Medicine

## 2022-10-27 ENCOUNTER — Emergency Department (HOSPITAL_BASED_OUTPATIENT_CLINIC_OR_DEPARTMENT_OTHER): Payer: Medicare Other

## 2022-10-27 DIAGNOSIS — M7989 Other specified soft tissue disorders: Secondary | ICD-10-CM | POA: Diagnosis not present

## 2022-10-27 DIAGNOSIS — I10 Essential (primary) hypertension: Secondary | ICD-10-CM | POA: Diagnosis not present

## 2022-10-27 DIAGNOSIS — F0390 Unspecified dementia without behavioral disturbance: Secondary | ICD-10-CM | POA: Insufficient documentation

## 2022-10-27 DIAGNOSIS — Z7901 Long term (current) use of anticoagulants: Secondary | ICD-10-CM | POA: Insufficient documentation

## 2022-10-27 DIAGNOSIS — I82411 Acute embolism and thrombosis of right femoral vein: Secondary | ICD-10-CM | POA: Insufficient documentation

## 2022-10-27 DIAGNOSIS — Z85048 Personal history of other malignant neoplasm of rectum, rectosigmoid junction, and anus: Secondary | ICD-10-CM | POA: Diagnosis not present

## 2022-10-27 DIAGNOSIS — I82431 Acute embolism and thrombosis of right popliteal vein: Secondary | ICD-10-CM | POA: Diagnosis not present

## 2022-10-27 LAB — COMPREHENSIVE METABOLIC PANEL
ALT: 18 U/L (ref 0–44)
AST: 17 U/L (ref 15–41)
Albumin: 3.7 g/dL (ref 3.5–5.0)
Alkaline Phosphatase: 73 U/L (ref 38–126)
Anion gap: 9 (ref 5–15)
BUN: 19 mg/dL (ref 8–23)
CO2: 25 mmol/L (ref 22–32)
Calcium: 9 mg/dL (ref 8.9–10.3)
Chloride: 101 mmol/L (ref 98–111)
Creatinine, Ser: 1.04 mg/dL (ref 0.61–1.24)
GFR, Estimated: >60 mL/min (ref >60)
Glucose, Bld: 100 mg/dL — ABNORMAL HIGH (ref 70–99)
Potassium: 3.9 mmol/L (ref 3.5–5.1)
Sodium: 135 mmol/L (ref 135–145)
Total Bilirubin: 0.6 mg/dL (ref 0.3–1.2)
Total Protein: 8 g/dL (ref 6.5–8.1)

## 2022-10-27 LAB — CBC WITH DIFFERENTIAL/PLATELET
Abs Immature Granulocytes: 0.02 10*3/uL (ref 0.00–0.07)
Basophils Absolute: 0.1 10*3/uL (ref 0.0–0.1)
Basophils Relative: 1 %
Eosinophils Absolute: 1 10*3/uL — ABNORMAL HIGH (ref 0.0–0.5)
Eosinophils Relative: 11 %
HCT: 44.5 % (ref 39.0–52.0)
Hemoglobin: 15.8 g/dL (ref 13.0–17.0)
Immature Granulocytes: 0 %
Lymphocytes Relative: 21 %
Lymphs Abs: 1.9 10*3/uL (ref 0.7–4.0)
MCH: 30.5 pg (ref 26.0–34.0)
MCHC: 35.5 g/dL (ref 30.0–36.0)
MCV: 85.9 fL (ref 80.0–100.0)
Monocytes Absolute: 0.6 10*3/uL (ref 0.1–1.0)
Monocytes Relative: 7 %
Neutro Abs: 5.3 10*3/uL (ref 1.7–7.7)
Neutrophils Relative %: 60 %
Platelets: 191 10*3/uL (ref 150–400)
RBC: 5.18 MIL/uL (ref 4.22–5.81)
RDW: 12.1 % (ref 11.5–15.5)
WBC: 8.8 10*3/uL (ref 4.0–10.5)
nRBC: 0 % (ref 0.0–0.2)

## 2022-10-27 MED ORDER — RIVAROXABAN 15 MG PO TABS
15.0000 mg | ORAL_TABLET | Freq: Once | ORAL | Status: AC
  Administered 2022-10-27: 15 mg via ORAL
  Filled 2022-10-27: qty 1

## 2022-10-27 MED ORDER — RIVAROXABAN (XARELTO) VTE STARTER PACK (15 & 20 MG): ORAL_TABLET | ORAL | 0 refills | Status: DC

## 2022-10-27 NOTE — ED Provider Notes (Signed)
[facility] Provider Note   CSN: [id number] Arrival date & time: 10/27/22  1900     History  Chief Complaint  Patient presents with   Leg Pain    Brent Potter is a 67 y.o. male with PMHx early dementia, hypertension, hyperlipidemia, anxiety, adenocarcinoma of rectum status post TAMIS partial proctectomy 08/2020 who presents for evaluation of right leg swelling.  Patient presents today with his wife.  Wife reports that for the last week he has "not been moving much".  She noticed today swelling to his right leg while bathing him.  She notes that when touching his calf he jumped in pain.  He currently denies any leg pain.  His wife tells me that his daughter relates that in the remote past he did have a "small blood clot" in his leg after sports injury.  He does have a history of cancer, in remission. No recent travel or other injury. No SOB, chest pain. He feels well.       Home Medications Prior to Admission medications   Medication Sig Start Date End Date Taking? Authorizing Provider  RIVAROXABAN Alveda Reasons) VTE STARTER PACK (15 & 20 MG) Follow package directions: Take one 15mg  tablet by mouth twice a day. On day 22, switch to one 20mg  tablet once a day. Take with food. 10/27/22  Yes Long, Wonda Olds, MD  ASHWAGANDHA PO Take 1 tablet by mouth at bedtime.    [provider]  Cholecalciferol (VITAMIN D) 50 MCG (2000 UT) tablet Take 2,000 Units by mouth daily.    [provider]  donepezil (ARICEPT) 10 MG tablet Take 10 mg by mouth at bedtime.    [provider]  DULoxetine (CYMBALTA) 30 MG capsule Take 30 mg by mouth daily.    [provider]  hydrocortisone cream 1 % Apply 1 Application topically daily as needed for itching (rash).    [provider]  Magnesium 500 MG TABS Take 500 mg by mouth daily.    [provider]  memantine (NAMENDA) 10 MG tablet Take 10 mg by mouth 2 (two) times  daily.    [provider]  Multiple Vitamins-Minerals (MULTIVITAMIN WITH MINERALS) tablet Take 1 tablet by mouth daily. Centrum    [provider]  Omega-3 Fatty Acids (FISH OIL) 1200 MG CAPS Take 1,200 mg by mouth daily.    [provider]  PRESCRIPTION MEDICATION Apply 2 mLs topically daily. Testosterone 10% gel    [provider]  traMADol (ULTRAM) 50 MG tablet Take 1-2 tablets (50-100 mg total) by mouth every 6 (six) hours as needed. 06/09/22   Coralie Keens, MD  traZODone (DESYREL) 50 MG tablet Take 50 mg by mouth 2 (two) times daily as needed (agitation and anxiety). 05/24/22   [provider]      Allergies    Hctz [hydrochlorothiazide] and Losartan    Review of Systems   Review of Systems  Constitutional:  Negative for fever.  HENT:  Negative for congestion and rhinorrhea.   Respiratory:  Negative for shortness of breath.   Cardiovascular:  Negative for chest pain.  Gastrointestinal:  Negative for abdominal pain.  Musculoskeletal:        +RLL non-pitting edema    Physical Exam Updated Vital Signs BP 126/87 (BP Location: Left Arm)   Pulse 92   Temp 98.2 F (36.8 C) (Oral)   Resp 18   Ht 6\' 1"  (1.854 m)   Wt 97.5 kg  SpO2 98%   BMI 28.37 kg/m  Physical Exam Constitutional:      Appearance: Normal appearance. He is normal weight.  HENT:     Head: Normocephalic.     Nose: Nose normal.     Mouth/Throat:     Mouth: Mucous membranes are moist.     Pharynx: Oropharynx is clear.  Eyes:     Conjunctiva/sclera: Conjunctivae normal.  Cardiovascular:     Rate and Rhythm: Normal rate and regular rhythm.     Pulses: Normal pulses.     Heart sounds: No murmur heard. Pulmonary:     Effort: Pulmonary effort is normal. No respiratory distress.     Breath sounds: Normal breath sounds. No wheezing, rhonchi or rales.  Abdominal:     General: Bowel sounds are normal. There is no distension.     Palpations: Abdomen is soft.      Tenderness: There is no abdominal tenderness.  Musculoskeletal:        General: Normal range of motion.     Cervical back: Neck supple.     Comments: +RLL non-pitting edema to right calf   Skin:    General: Skin is warm and dry.     Capillary Refill: Capillary refill takes less than 2 seconds.     Comments: Erythematous and dry patch (approximately 3x3cm to distal medial RLL without streaking erythema, edema, drainage.  Neurological:     General: No focal deficit present.     Mental Status: He is alert.  Psychiatric:        Mood and Affect: Mood normal.        Behavior: Behavior normal.     ED Results / Procedures / Treatments   Labs (all labs ordered are listed, but only abnormal results are displayed) Labs Reviewed  CBC WITH DIFFERENTIAL/PLATELET - Abnormal; Notable for the following components:      Result Value   Eosinophils Absolute 1.0 (*)    All other components within normal limits  COMPREHENSIVE METABOLIC PANEL - Abnormal; Notable for the following components:   Glucose, Bld 100 (*)    All other components within normal limits   EKG None  Radiology US Venous Img Lower Unilateral Right (DVT)  Result Date: 10/27/2022 CLINICAL DATA:  Right leg pain EXAM: RIGHT LOWER EXTREMITY VENOUS DOPPLER ULTRASOUND TECHNIQUE: Gray-scale sonography with graded compression, as well as color Doppler and duplex ultrasound were performed to evaluate the lower extremity deep venous systems from the level of the common femoral vein and including the common femoral, femoral, profunda femoral, popliteal and calf veins including the posterior tibial, peroneal and gastrocnemius veins when visible. The superficial great saphenous vein was also interrogated. Spectral Doppler was utilized to evaluate flow at rest and with distal augmentation maneuvers in the common femoral, femoral and popliteal veins. COMPARISON:  None Available. FINDINGS: Contralateral Common Femoral Vein: Respiratory phasicity is  normal and symmetric with the symptomatic side. No evidence of thrombus. Normal compressibility. The common femoral vein on the right was without evidence of thrombus. There is extensive occlusive to nearly occlusive DVT in the right lower extremity involving the entire right femoral vein, popliteal vein, in the posterior tibial veins to the level of the ankle. The peroneal veins are not visualized and could not be assessed for the presence of thrombus. Calf Veins: No evidence of thrombus. Normal compressibility and flow on color Doppler imaging. Superficial Great Saphenous Vein: No evidence of thrombus. Normal compressibility. Other Findings:  None. IMPRESSION: There is extensive occlusive  to nearly occlusive DVT in the right lower extremity involving the entire right femoral vein, popliteal vein, in the posterior tibial veins to the level of the ankle. The peroneal veins are not visualized and could not be assessed for the presence of thrombus. Electronically Signed   By: Marin Roberts M.D.   On: 10/27/2022 20:21    Procedures Procedures    Medications Ordered in ED Medications  Rivaroxaban (XARELTO) tablet 15 mg (has no administration in time range)    ED Course/ Medical Decision Making/ A&P                             Medical Decision Making 67 year old man with acute onset right lower extremity edema.  Differential includes DVT, superficial thrombophlebitis, cellulitis (appears unlikely). Has risk factors for DVT given unilateral swelling and hx of being sedentary. Initial labs unremarkable. Will do LE DVT U/S. No signs or symptoms concerning for PE at this time, normal oxygen saturation and without tachycardia.    8:28 PM DVT ultrasound is extensive exclusive to nearly occlusive DVT in the right lower extremity involving entire right femoral vein, popliteal vein, posterior tibial veins.   8:47 PM Xarelto starter pack prescribed. Will provide with vascular info for f/u given extensive clot.  Return precautions for signs/symptoms of PE. Stable for d/c at this time.   Amount and/or Complexity of Data Reviewed Labs: ordered.         Final Clinical Impression(s) / ED Diagnoses Final diagnoses:  Acute deep vein thrombosis (DVT) of femoral vein of right lower extremity ([place])    Rx / DC Orders ED Discharge Orders          Ordered    RIVAROXABAN (XARELTO) VTE STARTER PACK (15 & 20 MG)        10/27/22 2046              Sharion Settler, DO 10/27/22 2054    Margette Fast, MD 10/29/22 1040

## 2022-10-27 NOTE — ED Triage Notes (Signed)
Patient here with right leg swelling.  Patient has pain in right leg when it was touched.  Patient's wife states that he has been sleeping more, since last Thursday.

## 2022-10-27 NOTE — Discharge Instructions (Addendum)
You have a large blood clot in your right leg.  We are sending a medication called Xarelto which is a blood thinner.  Is very important that you take this as directed for the entirety of the course (3 months).  Since the clot is so extensive, we are also sending you information to contact the vascular surgeons to see if this is something they are able to remove.  You should return if you have pain with breathing, feel short of breath, chest pain, if you fall or notice blood loss (in stool, urine, vomit, etc).  Please do NOT take any nonsteroidal anti-inflammatory medications such as ibuprofen, Aleve, Advil. This can interact with the blood thinner.

## 2022-11-01 ENCOUNTER — Ambulatory Visit: Payer: Medicare Other | Admitting: Physician Assistant

## 2022-11-01 VITALS — BP 119/81 | HR 77 | Temp 98.0°F | Resp 20 | Ht 73.0 in | Wt 207.5 lb

## 2022-11-01 DIAGNOSIS — D225 Melanocytic nevi of trunk: Secondary | ICD-10-CM | POA: Diagnosis not present

## 2022-11-01 DIAGNOSIS — L570 Actinic keratosis: Secondary | ICD-10-CM | POA: Diagnosis not present

## 2022-11-01 DIAGNOSIS — M7989 Other specified soft tissue disorders: Secondary | ICD-10-CM

## 2022-11-01 DIAGNOSIS — I82401 Acute embolism and thrombosis of unspecified deep veins of right lower extremity: Secondary | ICD-10-CM

## 2022-11-01 DIAGNOSIS — I872 Venous insufficiency (chronic) (peripheral): Secondary | ICD-10-CM | POA: Diagnosis not present

## 2022-11-01 DIAGNOSIS — Z85828 Personal history of other malignant neoplasm of skin: Secondary | ICD-10-CM | POA: Diagnosis not present

## 2022-11-01 DIAGNOSIS — L308 Other specified dermatitis: Secondary | ICD-10-CM | POA: Diagnosis not present

## 2022-11-01 NOTE — Progress Notes (Signed)
Requested by:  Josetta Huddle, MD [address] [place],  [place] [address]  Reason for consultation: new RLE DVT    History of Present Illness   Brent Potter is a 67 y.o. (04-11-1956) male who presents for evaluation of new right lower extremity DVT.  The patient has a history of early onset Alzheimer's, so most of the history obtained is from the patient's wife and daughter.  The patient's wife stated that she noticed a lot of swelling and redness of her husband's right calf on Thursday, 3/28.  The patient also had some pain in his right leg.  They went to the emergency room later that day and the patient was diagnosed with a DVT in the right femoral vein, popliteal vein, and posterior tibial veins.  Imaging demonstrated no clot in the right common femoral vein.  He had no tachycardia, chest pain, shortness of breath suggestive of possible PE.  He was started on a starter pack of Xarelto and referred to our office for further management.  The patient's wife states that her husband " was not moving much" over the past week, prior to onset of his DVT.  She states that his right leg swelling looks much better than it did on Thursday.  The patient denies any pain in his right leg currently. He subjectively feels better.  He has a history of rectal cancer, which has been in remission since 2022.  He has had no recent travel or trauma to the leg.  He has no prior history of DVT, but he was diagnosed with a right greater saphenous vein and saphenofemoral junction thrombus in 2012 after a sports injury.  This was treated with rest and elevation. He has no prior history of venous or arterial procedures.  He has never been on a blood thinner before.  Past Medical History:  Diagnosis Date   Anxiety    Early onset Alzheimer's dementia 2021   HTN (hypertension)    Currently off meds   Palpitations    Phlebitis    due to basketball injury   Pre-diabetes    Pt denies   Rectal  cancer     Past Surgical History:  Procedure Laterality Date   COLONOSCOPY W/ POLYPECTOMY     DOPPLER ECHOCARDIOGRAPHY  08/10/2007   EF 55-60%, impaired LV relataion, trace mitrial & tricuspid regurgitation   HEMORRHOID SURGERY N/A 08/14/2020   Procedure: HEMORRHOIDECTOMY;  Surgeon: Michael Boston, MD;  Location: [facility];  Service: General;  Laterality: N/A;   INGUINAL HERNIA REPAIR Right 06/09/2022   Procedure: OPEN RIGHT INGUINAL HERNIA REPAIR;  Surgeon: Coralie Keens, MD;  Location: [facility];  Service: General;  Laterality: Right;   INSERTION OF MESH Right 06/09/2022   Procedure: INSERTION OF MESH;  Surgeon: Coralie Keens, MD;  Location: [facility];  Service: General;  Laterality: Right;   WISDOM TOOTH EXTRACTION     XI ROBOT ASSISTED RECTOPEXY N/A 08/14/2020   Procedure: ROBOTIC PARTIAL PROCTECTOMY (TAMIS), RIGID PROCTOSCOPY, HEMORRHOIDAL LIGATION/PEXY;  Surgeon: Michael Boston, MD;  Location: [facility];  Service: General;  Laterality: N/A;    Social History   Socioeconomic History   Marital status: Married    Spouse name: Not on file   Number of children: Not on file   Years of education: Not on file   Highest education level: Not on file  Occupational History   Occupation: clinical psychologist  Tobacco Use   Smoking status: Never    Passive exposure:  Never   Smokeless tobacco: Never  Vaping Use   Vaping Use: Never used  Substance and Sexual Activity   Alcohol use: No   Drug use: Never   Sexual activity: Not on file  Other Topics Concern   Not on file  Social History Narrative   Not on file   Social Determinants of Health   Financial Resource Strain: Not on file  Food Insecurity: Not on file  Transportation Needs: Not on file  Physical Activity: Not on file  Stress: Not on file  Social Connections: Not on file  Intimate Partner Violence: Not on file    Family History  Problem Relation Age of Onset   Hypertension Father    Thyroid disease Mother     Current  Outpatient Medications  Medication Sig Dispense Refill   ASHWAGANDHA PO Take 1 tablet by mouth at bedtime.     Cholecalciferol (VITAMIN D) 50 MCG (2000 UT) tablet Take 2,000 Units by mouth daily.     donepezil (ARICEPT) 10 MG tablet Take 10 mg by mouth at bedtime.     DULoxetine (CYMBALTA) 30 MG capsule Take 30 mg by mouth daily.     hydrocortisone cream 1 % Apply 1 Application topically daily as needed for itching (rash).     Magnesium 500 MG TABS Take 500 mg by mouth daily.     memantine (NAMENDA) 10 MG tablet Take 10 mg by mouth 2 (two) times daily.     Multiple Vitamins-Minerals (MULTIVITAMIN WITH MINERALS) tablet Take 1 tablet by mouth daily. Centrum     Omega-3 Fatty Acids (FISH OIL) 1200 MG CAPS Take 1,200 mg by mouth daily.     PRESCRIPTION MEDICATION Apply 2 mLs topically daily. Testosterone 10% gel     RIVAROXABAN (XARELTO) VTE STARTER PACK (15 & 20 MG) Follow package directions: Take one 15mg  tablet by mouth twice a day. On day 22, switch to one 20mg  tablet once a day. Take with food. 51 each 0   traZODone (DESYREL) 50 MG tablet Take 50 mg by mouth 2 (two) times daily as needed (agitation and anxiety).     traMADol (ULTRAM) 50 MG tablet Take 1-2 tablets (50-100 mg total) by mouth every 6 (six) hours as needed. (Patient not taking: Reported on 11/01/2022) 25 tablet 0   No current facility-administered medications for this visit.    Allergies  Allergen Reactions   Hctz [Hydrochlorothiazide] Other (See Comments)    dehydrated   Losartan Other (See Comments)    severe fatigue/sex dysfunction    REVIEW OF SYSTEMS (negative unless checked):   Cardiac:  []  Chest pain or chest pressure? []  Shortness of breath upon activity? []  Shortness of breath when lying flat? []  Irregular heart rhythm?  Vascular:  []  Pain in calf, thigh, or hip brought on by walking? []  Pain in feet at night that wakes you up from your sleep? [x]  Blood clot in your veins? [x]  Leg swelling?  Pulmonary:   []  Oxygen at home? []  Productive cough? []  Wheezing?  Neurologic:  []  Sudden weakness in arms or legs? []  Sudden numbness in arms or legs? []  Sudden onset of difficult speaking or slurred speech? []  Temporary loss of vision in one eye? []  Problems with dizziness?  Gastrointestinal:  []  Blood in stool? []  Vomited blood?  Genitourinary:  []  Burning when urinating? []  Blood in urine?  Psychiatric:  []  Major depression  Hematologic:  []  Bleeding problems? []  Problems with blood clotting?  Dermatologic:  []  Rashes or ulcers?  Constitutional:  []  Fever or chills?  Ear/Nose/Throat:  []  Change in hearing? []  Nose bleeds? []  Sore throat?  Musculoskeletal:  []  Back pain? []  Joint pain? []  Muscle pain?   Physical Examination     Vitals:   11/01/22 1246  BP: 119/81  Pulse: 77  Resp: 20  Temp: 98 F (36.7 C)  TempSrc: Temporal  SpO2: 97%  Weight: 207 lb 8 oz (94.1 kg)  Height: 6\' 1"  ([id number] m)   Body mass index is 27.38 kg/m.  General:  WDWN in NAD; vital signs documented above Gait: Not observed HENT: WNL, normocephalic Pulmonary: normal non-labored breathing Cardiac: regular Abdomen: soft, NT, no masses Skin: without rashes Vascular Exam/Pulses: palpable DP pulses bilaterally Extremities: RLL non pitting edema Musculoskeletal: no muscle wasting or atrophy  Neurologic: alert to self;  No focal weakness or paresthesias are detected Psychiatric:  The pt has Normal affect.  Non-invasive Vascular Imaging   DVT Study (10/27/2022):  There is extensive occlusive to nearly occlusive DVT in the right lower extremity involving the entire right femoral vein, popliteal vein, in the posterior tibial veins to the level of the ankle. The peroneal veins are not visualized and could not be assessed for the presence of thrombus.  Medical Decision Making   Brent Potter is a 67 y.o. male who presents new right lower extremity DVT  The patient is the  emergency room on 10/27/2022 with new onset right lower extremity swelling and pain.  Duplex demonstrated a nearly occlusive DVT in the right femoral vein, popliteal vein, and posterior tibial veins.  There was no DVT noted in the common femoral vein.  He was prescribed a starter pack of Xarelto and referred to our office for further management On exam today the patient has improved right lower extremity swelling and no pain.  There is still some nonpitting edema in the right calf without erythema Since the patient's DVT does not involve the common femoral vein and his symptoms are improving with anticoagulation, he does not require thrombectomy.  I have encouraged him to continue exercise, elevating his legs, and the use of compression stockings for leg swelling His legs have been measured today and he received 15 to 20 mmHg knee-high compression stockings to wear daily.  I am referring him to the DVT clinic for further anticoagulation management.  He can follow-up with our office as needed   Gerri Lins, PA-C [facility]: [phone]  11/01/2022, 1:23 PM  Clinic MD: Roxanne Mins

## 2022-11-18 DIAGNOSIS — R410 Disorientation, unspecified: Secondary | ICD-10-CM | POA: Diagnosis not present

## 2022-11-18 DIAGNOSIS — R634 Abnormal weight loss: Secondary | ICD-10-CM | POA: Diagnosis not present

## 2022-11-21 ENCOUNTER — Ambulatory Visit (HOSPITAL_COMMUNITY)
Admission: RE | Admit: 2022-11-21 | Discharge: 2022-11-21 | Disposition: A | Discharge status: Home / Self Care | Payer: Medicare Other | Source: Ambulatory Visit | Attending: Vascular Surgery | Admitting: Vascular Surgery

## 2022-11-21 ENCOUNTER — Encounter (HOSPITAL_COMMUNITY): Payer: Self-pay

## 2022-11-21 ENCOUNTER — Other Ambulatory Visit (HOSPITAL_COMMUNITY): Payer: Self-pay

## 2022-11-21 VITALS — HR 72

## 2022-11-21 DIAGNOSIS — I82411 Acute embolism and thrombosis of right femoral vein: Secondary | ICD-10-CM | POA: Insufficient documentation

## 2022-11-21 MED ORDER — RIVAROXABAN 20 MG PO TABS: 20.0000 mg | ORAL_TABLET | Freq: Every day | ORAL | 5 refills | Status: DC

## 2022-11-21 NOTE — Patient Instructions (Signed)
-  Continue Xarelto 15 mg twice daily to complete 21 days then take 20 mg daily. Always take with food.  -Your refills have been sent to your [facility] in [place]. You may need to call the pharmacy to ask them to fill this when you start to run low on your current supply.  -I am referring you to hematology.  -It is important to take your medication around the same time every day.  -Avoid NSAIDs like ibuprofen (Advil, Motrin) and naproxen (Aleve) as well as aspirin doses over 100 mg daily. -Tylenol (acetaminophen) is the preferred over the counter pain medication to lower the risk of bleeding. -Be sure to alert all of your health care providers that you are taking an anticoagulant prior to starting a new medication or having a procedure. -Monitor for signs and symptoms of bleeding (abnormal bruising, prolonged bleeding, nose bleeds, bleeding from gums, discolored urine, black tarry stools). If you have fallen and hit your head OR if your bleeding is severe or not stopping, seek emergency care.  -Go to the emergency room if emergent signs and symptoms of new clot occur (new or worse swelling and pain in an arm or leg, shortness of breath, chest pain, fast or irregular heartbeats, lightheadedness, dizziness, fainting, coughing up blood) or if you experience a significant color change (pale or blue) in the extremity that has the DVT.  -We recommend you wear compression stockings as long as you are having swelling or pain. Be sure to purchase the correct size and take them off at night.   If you have any questions or need to reschedule an appointment, please call [phone] [facility].  If you are having an emergency, call 911 or present to the nearest emergency room.   What is a DVT?  -Deep vein thrombosis (DVT) is a condition in which a blood clot forms in a vein of the deep venous system which can occur in the lower leg, thigh, pelvis, arm, or neck. This condition is serious and can be life-threatening  if the clot travels to the arteries of the lungs and causing a blockage (pulmonary embolism, PE). A DVT can also damage veins in the leg, which can lead to long-term venous disease, leg pain, swelling, discoloration, and ulcers or sores (post-thrombotic syndrome).  -Treatment may include taking an anticoagulant medication to prevent more clots from forming and the current clot from growing, wearing compression stockings, and/or surgical procedures to remove or dissolve the clot.

## 2022-11-21 NOTE — Addendum Note (Signed)
Encounter addended by: Maeola Harman, MD on: 11/21/2022 11:42 AM  Actions taken: Clinical Note Signed

## 2022-11-21 NOTE — Progress Notes (Addendum)
DVT Clinic Note  Name: Brent Potter     MRN: [id number]     DOB: 1955-11-27     Sex: male  PCP: Emilio Aspen, MD  Today's Visit: Visit Information: Initial Visit  Referred to CPP by: Dr. Randie Heinz Reason for referral:  Chief Complaint  Patient presents with   Med Management - DVT   HISTORY OF PRESENT ILLNESS:  Brent Potter is a 67 y.o. male with PMH HTN, HLD, anxiety, early onset Alzheimer's, rectal cancer in remission since 2022, R greater saphenous vein and saphenofemoral junction thrombus in 2012 after a sports injury, who presents for follow up medication management after diagnosis of DVT on 10/27/22 in the ED. His wife had noticed new RLE swelling and calf pain. [place] in the ED found DVT in the R femoral vein, popliteal vein, and posterior tibial veins. He was discharged from the ED on a Xarelto starter pack, and he was seen by vascular surgery 11/01/22 with noted improvements in pain and swelling. Imaging demonstrated no clot in the R common femoral vein, so no need for thrombectomy. Follow up scheduled in DVT Clinic for follow up medication management.   Today patient is accompanied by his wife who provides the majority of his history. Reports that the patient is no longer in pain. They had difficulties getting the compression socks on, so he has not been wearing them. Reports swelling has improved but is not completely resolved. Denies abnormal bleeding or bruising. Reports 2 missed doses of Xarelto in the morning since starting it a few weeks ago. He is taking Xarelto with food, after breakfast and dinner. Patient's wife reports that for the 2 weeks prior to noticing his DVT symptoms, the patient had been laying in bed for most of the day and has continued to do so. His wife suspects that his activity level will not increase from this.   Positive Thrombotic Risk Factors: Bed rest >72 hours within 3 month Bleeding Risk Factors: Anticoagulant therapy  Negative Thrombotic Risk  Factors: Previous VTE, Recent surgery (within 3 months), Recent trauma (within 3 months), Recent admission to hospital with acute illness (within 3 months), Paralysis, paresis, or recent plaster cast immobilization of lower extremity, Central venous catheterization, Pregnancy, Sedentary journey lasting >8 hours within 4 weeks, Within 6 weeks postpartum, Recent cesarean section (within 3 months), Estrogen therapy, Testosterone therapy, Active cancer, Smoking, Obesity, Older age, Known thrombophilic condition, Recent COVID diagnosis (within 3 months), Erythropoiesis-stimulating agent, Non-malignant, chronic inflammatory condition  Rx Insurance Coverage: Medicare Rx Affordability: Xarelto is $45/month Preferred Pharmacy: [facility] in [place]   Past Medical History:  Diagnosis Date   Anxiety    Early onset Alzheimer's dementia 2021   HTN (hypertension)    Currently off meds   Palpitations    Phlebitis    due to basketball injury   Pre-diabetes    Pt denies   Rectal cancer     Past Surgical History:  Procedure Laterality Date   COLONOSCOPY W/ POLYPECTOMY     DOPPLER ECHOCARDIOGRAPHY  08/10/2007   EF 55-60%, impaired LV relataion, trace mitrial & tricuspid regurgitation   HEMORRHOID SURGERY N/A 08/14/2020   Procedure: HEMORRHOIDECTOMY;  Surgeon: Karie Soda, MD;  Location: [facility];  Service: General;  Laterality: N/A;   INGUINAL HERNIA REPAIR Right 06/09/2022   Procedure: OPEN RIGHT INGUINAL HERNIA REPAIR;  Surgeon: Abigail Miyamoto, MD;  Location: [facility] OR;  Service: General;  Laterality: Right;   INSERTION OF MESH Right 06/09/2022   Procedure: INSERTION OF  MESH;  Surgeon: Abigail Miyamoto, MD;  Location: [facility] OR;  Service: General;  Laterality: Right;   WISDOM TOOTH EXTRACTION     XI ROBOT ASSISTED RECTOPEXY N/A 08/14/2020   Procedure: ROBOTIC PARTIAL PROCTECTOMY (TAMIS), RIGID PROCTOSCOPY, HEMORRHOIDAL LIGATION/PEXY;  Surgeon: Karie Soda, MD;  Location: [facility];  Service: General;   Laterality: N/A;    Social History   Socioeconomic History   Marital status: Married    Spouse name: Not on file   Number of children: Not on file   Years of education: Not on file   Highest education level: Not on file  Occupational History   Occupation: clinical psychologist  Tobacco Use   Smoking status: Never    Passive exposure: Never   Smokeless tobacco: Never  Vaping Use   Vaping Use: Never used  Substance and Sexual Activity   Alcohol use: No   Drug use: Never   Sexual activity: Not on file  Other Topics Concern   Not on file  Social History Narrative   Not on file   Social Determinants of Health   Financial Resource Strain: Not on file  Food Insecurity: Not on file  Transportation Needs: Not on file  Physical Activity: Not on file  Stress: Not on file  Social Connections: Not on file  Intimate Partner Violence: Not on file    Family History  Problem Relation Age of Onset   Hypertension Father    Thyroid disease Mother     Allergies as of 11/21/2022 - Review Complete 11/21/2022  Allergen Reaction Noted   Hctz [hydrochlorothiazide] Other (See Comments) 07/29/2020   Losartan Other (See Comments) 07/29/2020    Current Outpatient Medications on File Prior to Encounter  Medication Sig Dispense Refill   Cholecalciferol (VITAMIN D) 50 MCG (2000 UT) tablet Take 2,000 Units by mouth daily.     donepezil (ARICEPT) 10 MG tablet Take 10 mg by mouth at bedtime.     DULoxetine (CYMBALTA) 60 MG capsule Take 60 mg by mouth daily.     Magnesium 500 MG TABS Take 500 mg by mouth daily.     memantine (NAMENDA) 10 MG tablet Take 10 mg by mouth 2 (two) times daily.     Multiple Vitamins-Minerals (MULTIVITAMIN WITH MINERALS) tablet Take 1 tablet by mouth daily. Centrum     Omega-3 Fatty Acids (FISH OIL) 1200 MG CAPS Take 1,200 mg by mouth daily.     RIVAROXABAN (XARELTO) VTE STARTER PACK (15 & 20 MG) Follow package directions: Take one  tablet by mouth twice a day. On day  22, switch to one  tablet once a day. Take with food. 51 each 0   traZODone (DESYREL) 50 MG tablet Take 50 mg by mouth 2 (two) times daily as needed (agitation and anxiety).     triamcinolone cream (KENALOG) 0.1 % Apply 1 Application topically 2 (two) times daily.     No current facility-administered medications on file prior to encounter.   REVIEW OF SYSTEMS:  Review of Systems  Respiratory:  Negative for shortness of breath.   Cardiovascular:  Negative for chest pain and palpitations.  Musculoskeletal:  Negative for myalgias.  Neurological:  Negative for dizziness and tingling.   PHYSICAL EXAMINATION:  Vitals:   11/21/22 1014  Pulse: 72  SpO2: 100%   Physical Exam Vitals reviewed.  Cardiovascular:     Rate and Rhythm: Normal rate.  Pulmonary:     Effort: Pulmonary effort is normal.  Musculoskeletal:  General: No tenderness.     Right lower leg: Edema (1+) present.     Left lower leg: No edema.  Skin:    Findings: No erythema.  Psychiatric:        Mood and Affect: Mood normal.        Behavior: Behavior normal.        Thought Content: Thought content normal.   Villalta Score for Post-Thrombotic Syndrome: Pain: Absent Cramps: Absent Heaviness: Absent Paresthesia: Absent Pruritus: Absent Pretibial Edema: Mild Skin Induration: Absent Hyperpigmentation: Absent Redness: Absent Venous Ectasia: Absent Pain on calf compression: Absent Villalta Preliminary Score: 1 Is venous ulcer present?: No If venous ulcer is present and score is <15, then 15 points total are assigned: Absent Villalta Total Score: 1  LABS:  CBC     Component Value Date/Time   WBC 8.8 10/27/2022 1913   RBC 5.18 10/27/2022 1913   HGB 15.8 10/27/2022 1913   HCT 44.5 10/27/2022 1913   PLT 191 10/27/2022 1913   MCV 85.9 10/27/2022 1913   MCH 30.5 10/27/2022 1913   MCHC 35.5 10/27/2022 1913   RDW 12.1 10/27/2022 1913   LYMPHSABS 1.9 10/27/2022 1913   MONOABS 0.6 10/27/2022 1913    EOSABS 1.0 (H) 10/27/2022 1913   BASOSABS 0.1 10/27/2022 1913    Hepatic Function      Component Value Date/Time   PROT 8.0 10/27/2022 1913   ALBUMIN 3.7 10/27/2022 1913   AST 17 10/27/2022 1913   ALT 18 10/27/2022 1913   ALKPHOS 73 10/27/2022 1913   BILITOT 0.6 10/27/2022 1913   BILIDIR 0.1 03/31/2011 1001    Renal Function   Lab Results  Component Value Date   CREATININE 1.04 10/27/2022   CREATININE 0.98 05/11/2022   CREATININE 1.05 08/15/2020    CrCl cannot be calculated (Patient's most recent lab result is older than the maximum 21 days allowed.).   [facility] Vascular Lab Studies:  10/27/22 doppler IMPRESSION: There is extensive occlusive to nearly occlusive DVT in the right lower extremity involving the entire right femoral vein, popliteal vein, in the posterior tibial veins to the level of the ankle. The peroneal veins are not visualized and could not be assessed for the presence of thrombus.  ASSESSMENT: Location of DVT: Right femoral vein, Right popliteal vein, Right distal vein Cause of DVT: provoked by a persistent risk factor - immobility. Per wife, patient was not getting out of bed for most of the day for the 2 weeks prior to onset of DVT symptoms. Since diagnosis almost a month ago, he has continued to stay in bed except to go to the bathroom and eat. His wife suspects that this will continue to be the case and that his activity levels will not go back to his prior baseline. She is also concerned that given his early dementia, she may not know when he is in pain in the future to know if he develops another DVT. Given this and suspicion that provoking risk factor of immobility will continue to persist, he may benefit from long-term anticoagulation but would appreciate recommendations from hematology on duration of anticoagulation. Could consider treating for 6 months with full dose anticoagulation followed by reduced extended dose anticoagulation per the EINSTEIN CHOICE trial,  but will defer risk/benefit conversation to hematology.   PLAN: -Continue rivaroxaban (Xarelto) 15 mg twice daily with food for 21 days followed by 20 mg daily with food. -Expected duration of therapy: At least 3-6 months, further decisions on duration per hematology. Therapy  started on 10/27/22. -Patient educated on purpose, proper use and potential adverse effects of rivaroxaban (Xarelto). -Discussed importance of taking medication around the same time every day. -Advised patient of medications to avoid (NSAIDs, aspirin doses >100 mg daily). -Educated that Tylenol (acetaminophen) is the preferred analgesic to lower the risk of bleeding. -Advised patient to alert all providers of anticoagulation therapy prior to starting a new medication or having a procedure. -Emphasized importance of monitoring for signs and symptoms of bleeding (abnormal bruising, prolonged bleeding, nose bleeds, bleeding from gums, discolored urine, black tarry stools). -Educated patient to present to the ED if emergent signs and symptoms of new thrombosis occur. -Counseled patient to wear compression stockings daily, removing at night. -Provided patient with refills for Xarelto today to patient's preferred pharmacy.  -Discussed cost of Xarelto. Provided patient with one time $0 copay card since they have not used it yet.   Follow up: Referral to hematology placed. Follow up with DVT Clinic as needed.   Pervis Hocking, PharmD, [place], CPP [facility] Clinical Pharmacist Practitioner Office: [phone]  I have evaluated the patient's chart/imaging and refer this patient to the Clinical Pharmacist Practitioner for medication management. I have reviewed the CPP's documentation and agree with her assessment and plan. I was immediately available during the visit for questions and collaboration.   Lemar Livings, MD

## 2022-12-09 ENCOUNTER — Telehealth: Payer: Self-pay | Admitting: Hematology and Oncology

## 2022-12-10 ENCOUNTER — Inpatient Hospital Stay: Payer: Medicare Other | Admitting: Hematology and Oncology

## 2022-12-14 DIAGNOSIS — F419 Anxiety disorder, unspecified: Secondary | ICD-10-CM | POA: Diagnosis not present

## 2022-12-14 DIAGNOSIS — I10 Essential (primary) hypertension: Secondary | ICD-10-CM | POA: Diagnosis not present

## 2022-12-14 DIAGNOSIS — J343 Hypertrophy of nasal turbinates: Secondary | ICD-10-CM | POA: Diagnosis not present

## 2022-12-14 DIAGNOSIS — I82411 Acute embolism and thrombosis of right femoral vein: Secondary | ICD-10-CM | POA: Diagnosis not present

## 2022-12-14 DIAGNOSIS — I82431 Acute embolism and thrombosis of right popliteal vein: Secondary | ICD-10-CM | POA: Diagnosis not present

## 2022-12-14 DIAGNOSIS — I82441 Acute embolism and thrombosis of right tibial vein: Secondary | ICD-10-CM | POA: Diagnosis not present

## 2022-12-14 DIAGNOSIS — F0280 Dementia in other diseases classified elsewhere without behavioral disturbance: Secondary | ICD-10-CM | POA: Diagnosis not present

## 2022-12-14 DIAGNOSIS — J310 Chronic rhinitis: Secondary | ICD-10-CM | POA: Diagnosis not present

## 2022-12-14 DIAGNOSIS — Z7901 Long term (current) use of anticoagulants: Secondary | ICD-10-CM | POA: Diagnosis not present

## 2022-12-14 DIAGNOSIS — G300 Alzheimer's disease with early onset: Secondary | ICD-10-CM | POA: Diagnosis not present

## 2022-12-14 DIAGNOSIS — R7303 Prediabetes: Secondary | ICD-10-CM | POA: Diagnosis not present

## 2022-12-20 DIAGNOSIS — F0280 Dementia in other diseases classified elsewhere without behavioral disturbance: Secondary | ICD-10-CM | POA: Diagnosis not present

## 2022-12-20 DIAGNOSIS — G300 Alzheimer's disease with early onset: Secondary | ICD-10-CM | POA: Diagnosis not present

## 2022-12-23 DIAGNOSIS — F419 Anxiety disorder, unspecified: Secondary | ICD-10-CM | POA: Diagnosis not present

## 2022-12-23 DIAGNOSIS — G300 Alzheimer's disease with early onset: Secondary | ICD-10-CM | POA: Diagnosis not present

## 2022-12-23 DIAGNOSIS — J343 Hypertrophy of nasal turbinates: Secondary | ICD-10-CM | POA: Diagnosis not present

## 2022-12-23 DIAGNOSIS — J310 Chronic rhinitis: Secondary | ICD-10-CM | POA: Diagnosis not present

## 2022-12-23 DIAGNOSIS — I82441 Acute embolism and thrombosis of right tibial vein: Secondary | ICD-10-CM | POA: Diagnosis not present

## 2022-12-23 DIAGNOSIS — I82431 Acute embolism and thrombosis of right popliteal vein: Secondary | ICD-10-CM | POA: Diagnosis not present

## 2022-12-23 DIAGNOSIS — I82411 Acute embolism and thrombosis of right femoral vein: Secondary | ICD-10-CM | POA: Diagnosis not present

## 2022-12-23 DIAGNOSIS — F0280 Dementia in other diseases classified elsewhere without behavioral disturbance: Secondary | ICD-10-CM | POA: Diagnosis not present

## 2022-12-23 DIAGNOSIS — I10 Essential (primary) hypertension: Secondary | ICD-10-CM | POA: Diagnosis not present

## 2022-12-23 DIAGNOSIS — Z7901 Long term (current) use of anticoagulants: Secondary | ICD-10-CM | POA: Diagnosis not present

## 2022-12-23 DIAGNOSIS — R7303 Prediabetes: Secondary | ICD-10-CM | POA: Diagnosis not present

## 2022-12-28 DIAGNOSIS — R7303 Prediabetes: Secondary | ICD-10-CM | POA: Diagnosis not present

## 2022-12-28 DIAGNOSIS — F419 Anxiety disorder, unspecified: Secondary | ICD-10-CM | POA: Diagnosis not present

## 2022-12-28 DIAGNOSIS — G300 Alzheimer's disease with early onset: Secondary | ICD-10-CM | POA: Diagnosis not present

## 2022-12-28 DIAGNOSIS — F0280 Dementia in other diseases classified elsewhere without behavioral disturbance: Secondary | ICD-10-CM | POA: Diagnosis not present

## 2022-12-28 DIAGNOSIS — I82431 Acute embolism and thrombosis of right popliteal vein: Secondary | ICD-10-CM | POA: Diagnosis not present

## 2022-12-28 DIAGNOSIS — J343 Hypertrophy of nasal turbinates: Secondary | ICD-10-CM | POA: Diagnosis not present

## 2022-12-28 DIAGNOSIS — I82441 Acute embolism and thrombosis of right tibial vein: Secondary | ICD-10-CM | POA: Diagnosis not present

## 2022-12-28 DIAGNOSIS — Z7901 Long term (current) use of anticoagulants: Secondary | ICD-10-CM | POA: Diagnosis not present

## 2022-12-28 DIAGNOSIS — I10 Essential (primary) hypertension: Secondary | ICD-10-CM | POA: Diagnosis not present

## 2022-12-28 DIAGNOSIS — J310 Chronic rhinitis: Secondary | ICD-10-CM | POA: Diagnosis not present

## 2022-12-28 DIAGNOSIS — I82411 Acute embolism and thrombosis of right femoral vein: Secondary | ICD-10-CM | POA: Diagnosis not present

## 2022-12-29 DIAGNOSIS — Z7901 Long term (current) use of anticoagulants: Secondary | ICD-10-CM | POA: Diagnosis not present

## 2022-12-29 DIAGNOSIS — I82441 Acute embolism and thrombosis of right tibial vein: Secondary | ICD-10-CM | POA: Diagnosis not present

## 2022-12-29 DIAGNOSIS — I82431 Acute embolism and thrombosis of right popliteal vein: Secondary | ICD-10-CM | POA: Diagnosis not present

## 2022-12-29 DIAGNOSIS — R7303 Prediabetes: Secondary | ICD-10-CM | POA: Diagnosis not present

## 2022-12-29 DIAGNOSIS — I10 Essential (primary) hypertension: Secondary | ICD-10-CM | POA: Diagnosis not present

## 2022-12-29 DIAGNOSIS — F0280 Dementia in other diseases classified elsewhere without behavioral disturbance: Secondary | ICD-10-CM | POA: Diagnosis not present

## 2022-12-29 DIAGNOSIS — F419 Anxiety disorder, unspecified: Secondary | ICD-10-CM | POA: Diagnosis not present

## 2022-12-29 DIAGNOSIS — J343 Hypertrophy of nasal turbinates: Secondary | ICD-10-CM | POA: Diagnosis not present

## 2022-12-29 DIAGNOSIS — I82411 Acute embolism and thrombosis of right femoral vein: Secondary | ICD-10-CM | POA: Diagnosis not present

## 2022-12-29 DIAGNOSIS — G300 Alzheimer's disease with early onset: Secondary | ICD-10-CM | POA: Diagnosis not present

## 2022-12-29 DIAGNOSIS — J310 Chronic rhinitis: Secondary | ICD-10-CM | POA: Diagnosis not present

## 2023-03-02 DEATH — deceased
